# Patient Record
Sex: Female | Born: 1940
Health system: Southern US, Community
[De-identification: ages and names within clinical notes are randomized; demographics above are authoritative.]

## PROBLEM LIST (undated history)

## (undated) DIAGNOSIS — I10 Essential (primary) hypertension: Secondary | ICD-10-CM

## (undated) DIAGNOSIS — M1990 Unspecified osteoarthritis, unspecified site: Secondary | ICD-10-CM

## (undated) HISTORY — PX: ABDOMINAL HYSTERECTOMY: SHX81

---

## 2004-09-04 ENCOUNTER — Ambulatory Visit: Payer: Self-pay | Admitting: Family Medicine

## 2005-10-08 ENCOUNTER — Ambulatory Visit: Payer: Self-pay | Admitting: Family Medicine

## 2006-12-16 ENCOUNTER — Ambulatory Visit: Payer: Self-pay | Admitting: Family Medicine

## 2007-01-11 ENCOUNTER — Ambulatory Visit: Payer: Self-pay | Admitting: Gastroenterology

## 2007-12-21 ENCOUNTER — Ambulatory Visit: Payer: Self-pay | Admitting: Family Medicine

## 2008-12-22 ENCOUNTER — Ambulatory Visit: Payer: Self-pay | Admitting: Family Medicine

## 2009-12-24 ENCOUNTER — Ambulatory Visit: Payer: Self-pay | Admitting: Family Medicine

## 2011-03-10 ENCOUNTER — Ambulatory Visit: Payer: Self-pay | Admitting: Family Medicine

## 2012-03-11 ENCOUNTER — Ambulatory Visit: Payer: Self-pay

## 2013-03-14 ENCOUNTER — Ambulatory Visit: Payer: Self-pay

## 2014-03-29 ENCOUNTER — Ambulatory Visit: Payer: Self-pay | Admitting: Internal Medicine

## 2014-03-29 DIAGNOSIS — Z1231 Encounter for screening mammogram for malignant neoplasm of breast: Secondary | ICD-10-CM | POA: Diagnosis not present

## 2014-10-02 DIAGNOSIS — E780 Pure hypercholesterolemia: Secondary | ICD-10-CM | POA: Diagnosis not present

## 2014-10-02 DIAGNOSIS — Z79899 Other long term (current) drug therapy: Secondary | ICD-10-CM | POA: Diagnosis not present

## 2014-10-02 DIAGNOSIS — I10 Essential (primary) hypertension: Secondary | ICD-10-CM | POA: Diagnosis not present

## 2015-04-04 ENCOUNTER — Other Ambulatory Visit: Payer: Self-pay | Admitting: Internal Medicine

## 2015-04-04 DIAGNOSIS — Z1239 Encounter for other screening for malignant neoplasm of breast: Secondary | ICD-10-CM | POA: Diagnosis not present

## 2015-04-04 DIAGNOSIS — E7800 Pure hypercholesterolemia, unspecified: Secondary | ICD-10-CM | POA: Diagnosis not present

## 2015-04-04 DIAGNOSIS — I10 Essential (primary) hypertension: Secondary | ICD-10-CM | POA: Diagnosis not present

## 2015-04-04 DIAGNOSIS — Z79899 Other long term (current) drug therapy: Secondary | ICD-10-CM | POA: Diagnosis not present

## 2015-04-04 DIAGNOSIS — R7309 Other abnormal glucose: Secondary | ICD-10-CM | POA: Diagnosis not present

## 2015-04-04 DIAGNOSIS — N390 Urinary tract infection, site not specified: Secondary | ICD-10-CM | POA: Diagnosis not present

## 2015-04-04 DIAGNOSIS — Z1231 Encounter for screening mammogram for malignant neoplasm of breast: Secondary | ICD-10-CM

## 2015-04-04 DIAGNOSIS — Z0000 Encounter for general adult medical examination without abnormal findings: Secondary | ICD-10-CM | POA: Diagnosis not present

## 2015-04-05 DIAGNOSIS — N390 Urinary tract infection, site not specified: Secondary | ICD-10-CM | POA: Diagnosis not present

## 2015-04-16 ENCOUNTER — Other Ambulatory Visit: Payer: Self-pay | Admitting: Internal Medicine

## 2015-04-16 ENCOUNTER — Ambulatory Visit
Admission: RE | Admit: 2015-04-16 | Discharge: 2015-04-16 | Disposition: A | Discharge status: Home / Self Care | Payer: Commercial Managed Care - HMO | Source: Ambulatory Visit | Attending: Internal Medicine | Admitting: Internal Medicine

## 2015-04-16 DIAGNOSIS — Z1231 Encounter for screening mammogram for malignant neoplasm of breast: Secondary | ICD-10-CM | POA: Diagnosis not present

## 2015-10-08 DIAGNOSIS — E7800 Pure hypercholesterolemia, unspecified: Secondary | ICD-10-CM | POA: Diagnosis not present

## 2015-10-08 DIAGNOSIS — I10 Essential (primary) hypertension: Secondary | ICD-10-CM | POA: Diagnosis not present

## 2015-10-08 DIAGNOSIS — Z79899 Other long term (current) drug therapy: Secondary | ICD-10-CM | POA: Diagnosis not present

## 2015-10-08 DIAGNOSIS — R7309 Other abnormal glucose: Secondary | ICD-10-CM | POA: Diagnosis not present

## 2016-04-07 DIAGNOSIS — Z1231 Encounter for screening mammogram for malignant neoplasm of breast: Secondary | ICD-10-CM | POA: Diagnosis not present

## 2016-04-07 DIAGNOSIS — R7309 Other abnormal glucose: Secondary | ICD-10-CM | POA: Diagnosis not present

## 2016-04-07 DIAGNOSIS — Z79899 Other long term (current) drug therapy: Secondary | ICD-10-CM | POA: Diagnosis not present

## 2016-04-07 DIAGNOSIS — E7800 Pure hypercholesterolemia, unspecified: Secondary | ICD-10-CM | POA: Diagnosis not present

## 2016-04-07 DIAGNOSIS — I10 Essential (primary) hypertension: Secondary | ICD-10-CM | POA: Diagnosis not present

## 2016-04-07 DIAGNOSIS — Z0000 Encounter for general adult medical examination without abnormal findings: Secondary | ICD-10-CM | POA: Diagnosis not present

## 2016-04-22 ENCOUNTER — Other Ambulatory Visit: Payer: Self-pay | Admitting: Internal Medicine

## 2016-04-22 DIAGNOSIS — Z1231 Encounter for screening mammogram for malignant neoplasm of breast: Secondary | ICD-10-CM

## 2016-05-27 ENCOUNTER — Ambulatory Visit
Admission: RE | Admit: 2016-05-27 | Discharge: 2016-05-27 | Disposition: A | Discharge status: Home / Self Care | Payer: Medicare HMO | Source: Ambulatory Visit | Attending: Internal Medicine | Admitting: Internal Medicine

## 2016-05-27 DIAGNOSIS — Z1231 Encounter for screening mammogram for malignant neoplasm of breast: Secondary | ICD-10-CM | POA: Diagnosis not present

## 2016-10-28 DIAGNOSIS — I10 Essential (primary) hypertension: Secondary | ICD-10-CM | POA: Diagnosis not present

## 2016-10-28 DIAGNOSIS — E7800 Pure hypercholesterolemia, unspecified: Secondary | ICD-10-CM | POA: Diagnosis not present

## 2016-10-28 DIAGNOSIS — R3129 Other microscopic hematuria: Secondary | ICD-10-CM | POA: Diagnosis not present

## 2016-10-28 DIAGNOSIS — Z79899 Other long term (current) drug therapy: Secondary | ICD-10-CM | POA: Diagnosis not present

## 2016-10-28 DIAGNOSIS — N390 Urinary tract infection, site not specified: Secondary | ICD-10-CM | POA: Diagnosis not present

## 2017-02-10 DIAGNOSIS — M25551 Pain in right hip: Secondary | ICD-10-CM | POA: Diagnosis not present

## 2017-02-10 DIAGNOSIS — M1611 Unilateral primary osteoarthritis, right hip: Secondary | ICD-10-CM | POA: Diagnosis not present

## 2017-04-11 DIAGNOSIS — J0100 Acute maxillary sinusitis, unspecified: Secondary | ICD-10-CM | POA: Diagnosis not present

## 2017-05-05 DIAGNOSIS — Z1211 Encounter for screening for malignant neoplasm of colon: Secondary | ICD-10-CM | POA: Diagnosis not present

## 2017-05-05 DIAGNOSIS — Z79899 Other long term (current) drug therapy: Secondary | ICD-10-CM | POA: Diagnosis not present

## 2017-05-05 DIAGNOSIS — Z6841 Body Mass Index (BMI) 40.0 and over, adult: Secondary | ICD-10-CM | POA: Diagnosis not present

## 2017-05-05 DIAGNOSIS — I10 Essential (primary) hypertension: Secondary | ICD-10-CM | POA: Diagnosis not present

## 2017-05-05 DIAGNOSIS — Z1231 Encounter for screening mammogram for malignant neoplasm of breast: Secondary | ICD-10-CM | POA: Diagnosis not present

## 2017-05-05 DIAGNOSIS — E7800 Pure hypercholesterolemia, unspecified: Secondary | ICD-10-CM | POA: Diagnosis not present

## 2017-05-05 DIAGNOSIS — Z0000 Encounter for general adult medical examination without abnormal findings: Secondary | ICD-10-CM | POA: Diagnosis not present

## 2017-06-01 ENCOUNTER — Other Ambulatory Visit: Payer: Self-pay | Admitting: Internal Medicine

## 2017-06-01 DIAGNOSIS — Z1231 Encounter for screening mammogram for malignant neoplasm of breast: Secondary | ICD-10-CM

## 2017-06-23 ENCOUNTER — Ambulatory Visit
Admission: RE | Admit: 2017-06-23 | Discharge: 2017-06-23 | Disposition: A | Discharge status: Home / Self Care | Payer: Medicare HMO | Source: Ambulatory Visit | Attending: Internal Medicine | Admitting: Internal Medicine

## 2017-06-23 DIAGNOSIS — Z1231 Encounter for screening mammogram for malignant neoplasm of breast: Secondary | ICD-10-CM | POA: Insufficient documentation

## 2017-08-07 DIAGNOSIS — M25551 Pain in right hip: Secondary | ICD-10-CM | POA: Diagnosis not present

## 2017-08-07 DIAGNOSIS — M1611 Unilateral primary osteoarthritis, right hip: Secondary | ICD-10-CM | POA: Diagnosis not present

## 2017-08-07 DIAGNOSIS — G8929 Other chronic pain: Secondary | ICD-10-CM | POA: Diagnosis not present

## 2017-12-01 DIAGNOSIS — E7800 Pure hypercholesterolemia, unspecified: Secondary | ICD-10-CM | POA: Diagnosis not present

## 2017-12-01 DIAGNOSIS — I10 Essential (primary) hypertension: Secondary | ICD-10-CM | POA: Diagnosis not present

## 2017-12-01 DIAGNOSIS — G8929 Other chronic pain: Secondary | ICD-10-CM | POA: Diagnosis not present

## 2017-12-01 DIAGNOSIS — Z79899 Other long term (current) drug therapy: Secondary | ICD-10-CM | POA: Diagnosis not present

## 2017-12-01 DIAGNOSIS — Z1211 Encounter for screening for malignant neoplasm of colon: Secondary | ICD-10-CM | POA: Diagnosis not present

## 2017-12-01 DIAGNOSIS — M25561 Pain in right knee: Secondary | ICD-10-CM | POA: Diagnosis not present

## 2018-09-06 DIAGNOSIS — R262 Difficulty in walking, not elsewhere classified: Secondary | ICD-10-CM | POA: Diagnosis not present

## 2018-12-21 DIAGNOSIS — I10 Essential (primary) hypertension: Secondary | ICD-10-CM | POA: Diagnosis not present

## 2018-12-21 DIAGNOSIS — E7800 Pure hypercholesterolemia, unspecified: Secondary | ICD-10-CM | POA: Diagnosis not present

## 2018-12-22 ENCOUNTER — Other Ambulatory Visit: Payer: Self-pay | Admitting: Internal Medicine

## 2018-12-22 DIAGNOSIS — Z1231 Encounter for screening mammogram for malignant neoplasm of breast: Secondary | ICD-10-CM

## 2019-01-18 DIAGNOSIS — I10 Essential (primary) hypertension: Secondary | ICD-10-CM | POA: Diagnosis not present

## 2019-01-18 DIAGNOSIS — Z79899 Other long term (current) drug therapy: Secondary | ICD-10-CM | POA: Diagnosis not present

## 2019-01-18 DIAGNOSIS — E7800 Pure hypercholesterolemia, unspecified: Secondary | ICD-10-CM | POA: Diagnosis not present

## 2019-01-25 DIAGNOSIS — I10 Essential (primary) hypertension: Secondary | ICD-10-CM | POA: Diagnosis not present

## 2019-03-14 ENCOUNTER — Ambulatory Visit
Admission: RE | Admit: 2019-03-14 | Discharge: 2019-03-14 | Disposition: A | Discharge status: Home / Self Care | Payer: Medicare HMO | Source: Ambulatory Visit | Attending: Internal Medicine | Admitting: Internal Medicine

## 2019-03-14 DIAGNOSIS — Z1231 Encounter for screening mammogram for malignant neoplasm of breast: Secondary | ICD-10-CM | POA: Diagnosis not present

## 2019-03-23 DIAGNOSIS — L299 Pruritus, unspecified: Secondary | ICD-10-CM | POA: Diagnosis not present

## 2019-03-23 DIAGNOSIS — I10 Essential (primary) hypertension: Secondary | ICD-10-CM | POA: Diagnosis not present

## 2019-03-28 DIAGNOSIS — D2261 Melanocytic nevi of right upper limb, including shoulder: Secondary | ICD-10-CM | POA: Diagnosis not present

## 2019-03-28 DIAGNOSIS — D225 Melanocytic nevi of trunk: Secondary | ICD-10-CM | POA: Diagnosis not present

## 2019-03-28 DIAGNOSIS — D2262 Melanocytic nevi of left upper limb, including shoulder: Secondary | ICD-10-CM | POA: Diagnosis not present

## 2019-03-28 DIAGNOSIS — L309 Dermatitis, unspecified: Secondary | ICD-10-CM | POA: Diagnosis not present

## 2019-03-30 ENCOUNTER — Ambulatory Visit: Payer: Medicare HMO

## 2019-03-30 ENCOUNTER — Other Ambulatory Visit: Payer: Self-pay | Admitting: Physical Medicine and Rehabilitation

## 2019-03-30 DIAGNOSIS — G8929 Other chronic pain: Secondary | ICD-10-CM | POA: Diagnosis not present

## 2019-03-30 DIAGNOSIS — M25551 Pain in right hip: Secondary | ICD-10-CM | POA: Diagnosis not present

## 2019-03-30 DIAGNOSIS — S73004A Unspecified dislocation of right hip, initial encounter: Secondary | ICD-10-CM

## 2019-03-30 DIAGNOSIS — M1611 Unilateral primary osteoarthritis, right hip: Secondary | ICD-10-CM | POA: Diagnosis not present

## 2019-03-31 ENCOUNTER — Other Ambulatory Visit: Payer: Self-pay

## 2019-03-31 ENCOUNTER — Ambulatory Visit
Admission: RE | Admit: 2019-03-31 | Discharge: 2019-03-31 | Disposition: A | Discharge status: Home / Self Care | Payer: Medicare HMO | Source: Ambulatory Visit | Attending: Physical Medicine and Rehabilitation | Admitting: Physical Medicine and Rehabilitation

## 2019-03-31 DIAGNOSIS — M25551 Pain in right hip: Secondary | ICD-10-CM | POA: Insufficient documentation

## 2019-03-31 DIAGNOSIS — S73004A Unspecified dislocation of right hip, initial encounter: Secondary | ICD-10-CM | POA: Diagnosis not present

## 2019-04-01 ENCOUNTER — Other Ambulatory Visit
Admission: RE | Admit: 2019-04-01 | Discharge: 2019-04-01 | Disposition: A | Discharge status: Home / Self Care | Payer: Medicare HMO | Source: Ambulatory Visit | Attending: Orthopedic Surgery | Admitting: Orthopedic Surgery

## 2019-04-01 ENCOUNTER — Encounter
Admission: RE | Admit: 2019-04-01 | Discharge: 2019-04-01 | Disposition: A | Discharge status: Home / Self Care | Payer: Medicare HMO | Source: Ambulatory Visit | Attending: Orthopedic Surgery | Admitting: Orthopedic Surgery

## 2019-04-01 ENCOUNTER — Other Ambulatory Visit: Payer: Self-pay

## 2019-04-01 ENCOUNTER — Other Ambulatory Visit: Payer: Self-pay | Admitting: Orthopedic Surgery

## 2019-04-01 ENCOUNTER — Other Ambulatory Visit: Admission: RE | Admit: 2019-04-01 | Payer: Medicare HMO | Source: Ambulatory Visit

## 2019-04-01 DIAGNOSIS — Z20822 Contact with and (suspected) exposure to covid-19: Secondary | ICD-10-CM | POA: Diagnosis not present

## 2019-04-01 DIAGNOSIS — Z01818 Encounter for other preprocedural examination: Secondary | ICD-10-CM | POA: Insufficient documentation

## 2019-04-01 DIAGNOSIS — M1611 Unilateral primary osteoarthritis, right hip: Secondary | ICD-10-CM | POA: Diagnosis not present

## 2019-04-01 DIAGNOSIS — R9431 Abnormal electrocardiogram [ECG] [EKG]: Secondary | ICD-10-CM | POA: Insufficient documentation

## 2019-04-01 DIAGNOSIS — M247 Protrusio acetabuli: Secondary | ICD-10-CM | POA: Diagnosis not present

## 2019-04-01 HISTORY — DX: Unspecified osteoarthritis, unspecified site: M19.90

## 2019-04-01 HISTORY — DX: Essential (primary) hypertension: I10

## 2019-04-01 LAB — COMPREHENSIVE METABOLIC PANEL
ALT: 10 U/L (ref 0–44)
AST: 16 U/L (ref 15–41)
Albumin: 4.2 g/dL (ref 3.5–5.0)
Alkaline Phosphatase: 59 U/L (ref 38–126)
Anion gap: 11 (ref 5–15)
BUN: 16 mg/dL (ref 8–23)
CO2: 26 mmol/L (ref 22–32)
Calcium: 9.5 mg/dL (ref 8.9–10.3)
Chloride: 103 mmol/L (ref 98–111)
Creatinine, Ser: 0.64 mg/dL (ref 0.44–1.00)
GFR calc Af Amer: >60 mL/min (ref >60)
GFR calc non Af Amer: >60 mL/min (ref >60)
Glucose, Bld: 102 mg/dL — ABNORMAL HIGH (ref 70–99)
Potassium: 4.1 mmol/L (ref 3.5–5.1)
Sodium: 140 mmol/L (ref 135–145)
Total Bilirubin: 0.7 mg/dL (ref 0.3–1.2)
Total Protein: 7.6 g/dL (ref 6.5–8.1)

## 2019-04-01 LAB — CBC WITH DIFFERENTIAL/PLATELET
Abs Immature Granulocytes: 0.02 10*3/uL (ref 0.00–0.07)
Basophils Absolute: 0 10*3/uL (ref 0.0–0.1)
Basophils Relative: 0 %
Eosinophils Absolute: 0.1 10*3/uL (ref 0.0–0.5)
Eosinophils Relative: 1 %
HCT: 45.9 % (ref 36.0–46.0)
Hemoglobin: 14.7 g/dL (ref 12.0–15.0)
Immature Granulocytes: 0 %
Lymphocytes Relative: 10 %
Lymphs Abs: 0.7 10*3/uL (ref 0.7–4.0)
MCH: 29.7 pg (ref 26.0–34.0)
MCHC: 32 g/dL (ref 30.0–36.0)
MCV: 92.7 fL (ref 80.0–100.0)
Monocytes Absolute: 0.5 10*3/uL (ref 0.1–1.0)
Monocytes Relative: 7 %
Neutro Abs: 5.8 10*3/uL (ref 1.7–7.7)
Neutrophils Relative %: 82 %
Platelets: 340 10*3/uL (ref 150–400)
RBC: 4.95 MIL/uL (ref 3.87–5.11)
RDW: 14.2 % (ref 11.5–15.5)
WBC: 7.1 10*3/uL (ref 4.0–10.5)
nRBC: 0 % (ref 0.0–0.2)

## 2019-04-01 LAB — TYPE AND SCREEN
ABO/RH(D): O POS
Antibody Screen: NEGATIVE

## 2019-04-01 LAB — URINALYSIS, ROUTINE W REFLEX MICROSCOPIC
Bilirubin Urine: NEGATIVE
Glucose, UA: NEGATIVE mg/dL
Hgb urine dipstick: NEGATIVE
Ketones, ur: NEGATIVE mg/dL
Leukocytes,Ua: NEGATIVE
Nitrite: NEGATIVE
Protein, ur: NEGATIVE mg/dL
Specific Gravity, Urine: 1.014 (ref 1.005–1.030)
pH: 6 (ref 5.0–8.0)

## 2019-04-01 LAB — SURGICAL PCR SCREEN
MRSA, PCR: NEGATIVE
Staphylococcus aureus: POSITIVE — AB

## 2019-04-01 LAB — SARS CORONAVIRUS 2 (TAT 6-24 HRS): SARS Coronavirus 2: NEGATIVE

## 2019-04-01 NOTE — Patient Instructions (Signed)
INSTRUCTIONS FOR SURGERY     Your surgery is scheduled for:   Tuesday, February 9TH     To find out your arrival time for the day of surgery,          please call [phone] between 1 pm and 3 pm on :  Monday, February 8TH     When you arrive for surgery, report to the [facility].       Do NOT stop on the first floor to register.    REMEMBER: Instructions that are not followed completely may result in serious medical risk,  up to and including death, or upon the discretion of your surgeon and anesthesiologist,            your surgery may need to be rescheduled.  __X__ 1. Do not eat food after midnight the night before your procedure.                    No gum, candy, lozenger, tic tacs, tums or hard candies.                  ABSOLUTELY NOTHING SOLID IN YOUR MOUTH AFTER MIDNIGHT                    You may drink unlimited clear liquids up to 2 hours before you are scheduled to arrive for surgery.                   Do not drink anything within those 2 hours unless you need to take medicine, then take the                   smallest amount you need.  Clear liquids include:  water, apple juice without pulp,                   any flavor Gatorade, Black coffee, black tea.  Sugar may be added but no dairy/ honey /lemon.                        Broth and jello is not considered a clear liquid.  __x__  2. On the morning of surgery, please brush your teeth with toothpaste and water. You may rinse with                  mouthwash if you wish but DO NOT SWALLOW TOOTHPASTE OR MOUTHWASH  __X___3. NO alcohol for 24 hours before or after surgery.  __x___ 4.  Do NOT smoke or use e-cigarettes for 24 HOURS PRIOR TO SURGERY.                      DO NOT Use any chewable tobacco products for at least 6 hours prior to surgery.  __x___ 5. If you start any new medication after this appointment and prior to surgery, please        Bring it with you on the day of surgery.  ___x__ 6. Notify your doctor if there is any change in your medical condition, such as fever, infection, vomitting,  Diarrhea or any open sores.  __x___ 7.  USE the CHG SOAP as instructed, the night before surgery and the day of surgery.                   Once you have washed with this soap, do NOT use any of the following: Powders, perfumes                    or lotions. Please do not wear make up, hairpins, clips or nail polish. You MAY wear deodorant.                   Men may shave their face and neck.  Women need to shave 48 hours prior to surgery.                   DO NOT wear ANY jewelry on the day of surgery. If there are rings that are too tight to                    remove easily, please address this prior to the surgery day. Piercings need to be removed.                                                                     NO METAL ON YOUR BODY.                    Do NOT bring any valuables.  If you came to Pre-Admit testing then you will not need license,                     insurance card or credit card.  If you will be staying overnight, please either leave your things in                     the car or have your family be responsible for these items.                     [facility] IS NOT RESPONSIBLE FOR BELONGINGS OR VALUABLES.  ___X__ 8. DO NOT wear contact lenses on surgery day.  You may not have dentures,                     Hearing aides, contacts or glasses in the operating room. These items can be                    Placed in the Recovery Room to receive immediately after surgery.  __x___ 9. IF YOU ARE SCHEDULED TO GO HOME ON THE SAME DAY, YOU MUST                   Have someone to drive you home and to stay with you  for the first 24 hours.                    Have an arrangement prior to arriving on surgery day.  ___x__ 10. Take the following medications on the morning of surgery with a sip of water:  1.  NOTHING                     2.                     3.                                                    _____ 11.  Follow any instructions provided to you by your surgeon.                        Such as enema, clear liquid bowel prep  __X__  12. STOP  ASPIRIN AS OF: IMMEDIATELY TODAY                       THIS INCLUDES BC POWDERS / GOODIES POWDER  __x___ 13. STOP Anti-inflammatories as of: IMMEDIATELY TODAY                      This includes IBUPROFEN / MOTRIN / ADVIL / ALEVE/ NAPROXYN                    YOU MAY TAKE TYLENOL ANY TIME PRIOR TO SURGERY.  _____ 15. Bring your CPAP machine into preop with you on the morning of surgery.  _______17.  Continue to take the following medications but do not take on the morning of surgery:                          LISINOPRIL  ______18. If staying overnight, please have appropriate shoes to wear to be able to walk around the unit.                   Wear clean and comfortable clothing to the hospital.   [facility] PHONE AND CHARGER.  HAVE STABLE SHOES TO AMBULATE ON UNIT.

## 2019-04-01 NOTE — Patient Instructions (Signed)
See preop visit for notes. Hard copy also on chart

## 2019-04-04 MED ORDER — CEFAZOLIN SODIUM-DEXTROSE 2-4 GM/100ML-% IV SOLN
2.0000 g | INTRAVENOUS | Status: AC
  Administered 2019-04-05: 2 g via INTRAVENOUS

## 2019-04-05 ENCOUNTER — Inpatient Hospital Stay: Payer: Medicare HMO | Admitting: Certified Registered"

## 2019-04-05 ENCOUNTER — Encounter: Payer: Self-pay | Admitting: Orthopedic Surgery

## 2019-04-05 ENCOUNTER — Inpatient Hospital Stay: Payer: Medicare HMO

## 2019-04-05 ENCOUNTER — Other Ambulatory Visit: Payer: Self-pay

## 2019-04-05 ENCOUNTER — Inpatient Hospital Stay
Admission: RE | Admit: 2019-04-05 | Discharge: 2019-04-07 | DRG: 470 | Disposition: A | Discharge status: Home Health | Payer: Medicare HMO | Attending: Orthopedic Surgery | Admitting: Orthopedic Surgery

## 2019-04-05 ENCOUNTER — Encounter
Admission: RE | Disposition: A | Discharge status: Home Health | Payer: Self-pay | Source: Ambulatory Visit | Attending: Orthopedic Surgery

## 2019-04-05 DIAGNOSIS — M1611 Unilateral primary osteoarthritis, right hip: Secondary | ICD-10-CM | POA: Diagnosis not present

## 2019-04-05 DIAGNOSIS — I10 Essential (primary) hypertension: Secondary | ICD-10-CM | POA: Diagnosis not present

## 2019-04-05 DIAGNOSIS — Z96641 Presence of right artificial hip joint: Secondary | ICD-10-CM

## 2019-04-05 DIAGNOSIS — Z01818 Encounter for other preprocedural examination: Secondary | ICD-10-CM | POA: Diagnosis not present

## 2019-04-05 DIAGNOSIS — G8918 Other acute postprocedural pain: Secondary | ICD-10-CM

## 2019-04-05 DIAGNOSIS — Z471 Aftercare following joint replacement surgery: Secondary | ICD-10-CM | POA: Diagnosis not present

## 2019-04-05 DIAGNOSIS — Z419 Encounter for procedure for purposes other than remedying health state, unspecified: Secondary | ICD-10-CM

## 2019-04-05 HISTORY — PX: TOTAL HIP ARTHROPLASTY: SHX124

## 2019-04-05 LAB — CBC
HCT: 37 % (ref 36.0–46.0)
Hemoglobin: 12 g/dL (ref 12.0–15.0)
MCH: 30.2 pg (ref 26.0–34.0)
MCHC: 32.4 g/dL (ref 30.0–36.0)
MCV: 93 fL (ref 80.0–100.0)
Platelets: 282 10*3/uL (ref 150–400)
RBC: 3.98 MIL/uL (ref 3.87–5.11)
RDW: 14.1 % (ref 11.5–15.5)
WBC: 15.2 10*3/uL — ABNORMAL HIGH (ref 4.0–10.5)
nRBC: 0 % (ref 0.0–0.2)

## 2019-04-05 LAB — CREATININE, SERUM
Creatinine, Ser: 0.64 mg/dL (ref 0.44–1.00)
GFR calc Af Amer: >60 mL/min (ref >60)
GFR calc non Af Amer: >60 mL/min (ref >60)

## 2019-04-05 LAB — ABO/RH: ABO/RH(D): O POS

## 2019-04-05 SURGERY — ARTHROPLASTY, HIP, TOTAL, ANTERIOR APPROACH
Anesthesia: Spinal | Site: Hip | Laterality: Right

## 2019-04-05 MED ORDER — NEOMYCIN-POLYMYXIN B GU 40-200000 IR SOLN
Status: DC | PRN
  Administered 2019-04-05: 4 mL

## 2019-04-05 MED ORDER — KETAMINE HCL 50 MG/ML IJ SOLN
INTRAMUSCULAR | Status: AC
  Filled 2019-04-05: qty 10

## 2019-04-05 MED ORDER — MORPHINE SULFATE (PF) 2 MG/ML IV SOLN: 0.5000 mg | INTRAVENOUS | Status: DC | PRN

## 2019-04-05 MED ORDER — DOCUSATE SODIUM 100 MG PO CAPS
100.0000 mg | ORAL_CAPSULE | Freq: Two times a day (BID) | ORAL | Status: DC
  Administered 2019-04-05 – 2019-04-07 (×4): 100 mg via ORAL
  Filled 2019-04-05 (×4): qty 1

## 2019-04-05 MED ORDER — LIDOCAINE HCL (PF) 1 % IJ SOLN
INTRAMUSCULAR | Status: AC
  Filled 2019-04-05: qty 30

## 2019-04-05 MED ORDER — METOCLOPRAMIDE HCL 10 MG PO TABS: 5.0000 mg | ORAL_TABLET | Freq: Three times a day (TID) | ORAL | Status: DC | PRN

## 2019-04-05 MED ORDER — LACTATED RINGERS IV SOLN: INTRAVENOUS | Status: DC

## 2019-04-05 MED ORDER — NEOMYCIN-POLYMYXIN B GU 40-200000 IR SOLN
Status: AC
  Filled 2019-04-05: qty 20

## 2019-04-05 MED ORDER — PROPOFOL 10 MG/ML IV BOLUS
INTRAVENOUS | Status: DC | PRN
  Administered 2019-04-05: 30 mg via INTRAVENOUS
  Administered 2019-04-05: 25 ug/kg/min via INTRAVENOUS
  Administered 2019-04-05: 20 mg via INTRAVENOUS
  Administered 2019-04-05: 30 mg via INTRAVENOUS

## 2019-04-05 MED ORDER — HYDROXYZINE HCL 25 MG PO TABS
25.0000 mg | ORAL_TABLET | Freq: Three times a day (TID) | ORAL | Status: DC | PRN
  Filled 2019-04-05: qty 1

## 2019-04-05 MED ORDER — LIDOCAINE HCL (PF) 2 % IJ SOLN
INTRAMUSCULAR | Status: DC | PRN
  Administered 2019-04-05: 40 mg via INTRADERMAL

## 2019-04-05 MED ORDER — PROPOFOL 10 MG/ML IV BOLUS
INTRAVENOUS | Status: AC
  Filled 2019-04-05: qty 20

## 2019-04-05 MED ORDER — ONDANSETRON HCL 4 MG/2ML IJ SOLN
INTRAMUSCULAR | Status: DC | PRN
  Administered 2019-04-05: 4 mg via INTRAVENOUS

## 2019-04-05 MED ORDER — BISACODYL 10 MG RE SUPP: 10.0000 mg | Freq: Every day | RECTAL | Status: DC | PRN

## 2019-04-05 MED ORDER — EPINEPHRINE PF 1 MG/ML IJ SOLN
INTRAMUSCULAR | Status: AC
  Filled 2019-04-05: qty 1

## 2019-04-05 MED ORDER — SODIUM CHLORIDE 0.9 % IV SOLN
INTRAVENOUS | Status: DC | PRN
  Administered 2019-04-05: 60 mL

## 2019-04-05 MED ORDER — FAMOTIDINE 20 MG PO TABS
ORAL_TABLET | ORAL | Status: AC
  Administered 2019-04-05: 20 mg via ORAL
  Filled 2019-04-05: qty 1

## 2019-04-05 MED ORDER — DEXAMETHASONE SODIUM PHOSPHATE 10 MG/ML IJ SOLN
INTRAMUSCULAR | Status: DC | PRN
  Administered 2019-04-05: 5 mg via INTRAVENOUS

## 2019-04-05 MED ORDER — SODIUM CHLORIDE (PF) 0.9 % IJ SOLN
INTRAMUSCULAR | Status: AC
  Filled 2019-04-05: qty 50

## 2019-04-05 MED ORDER — BUPIVACAINE HCL (PF) 0.25 % IJ SOLN
INTRAMUSCULAR | Status: AC
  Filled 2019-04-05: qty 30

## 2019-04-05 MED ORDER — METOPROLOL TARTRATE 25 MG PO TABS
12.5000 mg | ORAL_TABLET | Freq: Two times a day (BID) | ORAL | Status: DC
  Administered 2019-04-05 – 2019-04-07 (×4): 12.5 mg via ORAL
  Filled 2019-04-05 (×4): qty 1

## 2019-04-05 MED ORDER — ACETAMINOPHEN 500 MG PO TABS
500.0000 mg | ORAL_TABLET | Freq: Four times a day (QID) | ORAL | Status: AC
  Administered 2019-04-05 – 2019-04-06 (×4): 500 mg via ORAL
  Filled 2019-04-05 (×4): qty 1

## 2019-04-05 MED ORDER — LISINOPRIL 20 MG PO TABS
20.0000 mg | ORAL_TABLET | Freq: Two times a day (BID) | ORAL | Status: DC
  Administered 2019-04-05 – 2019-04-07 (×4): 20 mg via ORAL
  Filled 2019-04-05 (×4): qty 1

## 2019-04-05 MED ORDER — METHOCARBAMOL 1000 MG/10ML IJ SOLN
500.0000 mg | Freq: Four times a day (QID) | INTRAVENOUS | Status: DC | PRN
  Filled 2019-04-05: qty 5

## 2019-04-05 MED ORDER — ALUM & MAG HYDROXIDE-SIMETH 200-200-20 MG/5ML PO SUSP: 30.0000 mL | ORAL | Status: DC | PRN

## 2019-04-05 MED ORDER — BUPIVACAINE HCL (PF) 0.5 % IJ SOLN
INTRAMUSCULAR | Status: AC
  Filled 2019-04-05: qty 10

## 2019-04-05 MED ORDER — PROPOFOL 500 MG/50ML IV EMUL
INTRAVENOUS | Status: AC
  Filled 2019-04-05: qty 50

## 2019-04-05 MED ORDER — ACETAMINOPHEN 325 MG PO TABS: 325.0000 mg | ORAL_TABLET | Freq: Four times a day (QID) | ORAL | Status: DC | PRN

## 2019-04-05 MED ORDER — ONDANSETRON HCL 4 MG/2ML IJ SOLN: 4.0000 mg | Freq: Four times a day (QID) | INTRAMUSCULAR | Status: DC | PRN

## 2019-04-05 MED ORDER — PANTOPRAZOLE SODIUM 40 MG PO TBEC
40.0000 mg | DELAYED_RELEASE_TABLET | Freq: Every day | ORAL | Status: DC
  Administered 2019-04-06 – 2019-04-07 (×2): 40 mg via ORAL
  Filled 2019-04-05 (×2): qty 1

## 2019-04-05 MED ORDER — MAGNESIUM CITRATE PO SOLN
1.0000 | Freq: Once | ORAL | Status: DC | PRN
  Filled 2019-04-05: qty 296

## 2019-04-05 MED ORDER — FAMOTIDINE 20 MG PO TABS: 20.0000 mg | ORAL_TABLET | Freq: Once | ORAL | Status: AC

## 2019-04-05 MED ORDER — LIDOCAINE HCL 1 % IJ SOLN
INTRAMUSCULAR | Status: DC | PRN
  Administered 2019-04-05: 30 mL

## 2019-04-05 MED ORDER — HYDROCODONE-ACETAMINOPHEN 5-325 MG PO TABS
1.0000 | ORAL_TABLET | ORAL | Status: DC | PRN
  Administered 2019-04-05 – 2019-04-06 (×3): 1 via ORAL
  Filled 2019-04-05 (×3): qty 1

## 2019-04-05 MED ORDER — SODIUM CHLORIDE (PF) 0.9 % IJ SOLN
INTRAMUSCULAR | Status: DC | PRN
  Administered 2019-04-05: 40 mL

## 2019-04-05 MED ORDER — BUPIVACAINE-EPINEPHRINE 0.25% -1:200000 IJ SOLN
INTRAMUSCULAR | Status: DC | PRN
  Administered 2019-04-05: 30 mL

## 2019-04-05 MED ORDER — FENTANYL CITRATE (PF) 100 MCG/2ML IJ SOLN
INTRAMUSCULAR | Status: AC
  Filled 2019-04-05: qty 2

## 2019-04-05 MED ORDER — CHLORHEXIDINE GLUCONATE 4 % EX LIQD
60.0000 mL | Freq: Once | CUTANEOUS | Status: AC
  Administered 2019-04-05: 4 via TOPICAL

## 2019-04-05 MED ORDER — CEFAZOLIN SODIUM-DEXTROSE 2-4 GM/100ML-% IV SOLN
INTRAVENOUS | Status: AC
  Filled 2019-04-05: qty 100

## 2019-04-05 MED ORDER — KETAMINE HCL 50 MG/ML IJ SOLN
INTRAMUSCULAR | Status: DC | PRN
  Administered 2019-04-05: 25 mg via INTRAVENOUS

## 2019-04-05 MED ORDER — PHENOL 1.4 % MT LIQD
1.0000 | OROMUCOSAL | Status: DC | PRN
  Filled 2019-04-05: qty 177

## 2019-04-05 MED ORDER — ONDANSETRON HCL 4 MG PO TABS: 4.0000 mg | ORAL_TABLET | Freq: Four times a day (QID) | ORAL | Status: DC | PRN

## 2019-04-05 MED ORDER — MAGNESIUM HYDROXIDE 400 MG/5ML PO SUSP
30.0000 mL | Freq: Every day | ORAL | Status: DC | PRN
  Administered 2019-04-07: 30 mL via ORAL
  Filled 2019-04-05: qty 30

## 2019-04-05 MED ORDER — FENTANYL CITRATE (PF) 100 MCG/2ML IJ SOLN
INTRAMUSCULAR | Status: DC | PRN
  Administered 2019-04-05 (×2): 25 ug via INTRAVENOUS
  Administered 2019-04-05: 50 ug via INTRAVENOUS

## 2019-04-05 MED ORDER — MENTHOL 3 MG MT LOZG
1.0000 | LOZENGE | OROMUCOSAL | Status: DC | PRN
  Filled 2019-04-05: qty 9

## 2019-04-05 MED ORDER — CEFAZOLIN SODIUM-DEXTROSE 1-4 GM/50ML-% IV SOLN
1.0000 g | Freq: Four times a day (QID) | INTRAVENOUS | Status: AC
  Administered 2019-04-05 – 2019-04-06 (×3): 1 g via INTRAVENOUS
  Filled 2019-04-05 (×3): qty 50

## 2019-04-05 MED ORDER — HYDROCODONE-ACETAMINOPHEN 7.5-325 MG PO TABS: 1.0000 | ORAL_TABLET | ORAL | Status: DC | PRN

## 2019-04-05 MED ORDER — ENOXAPARIN SODIUM 40 MG/0.4ML ~~LOC~~ SOLN
40.0000 mg | SUBCUTANEOUS | Status: DC
  Administered 2019-04-06 – 2019-04-07 (×2): 40 mg via SUBCUTANEOUS
  Filled 2019-04-05 (×2): qty 0.4

## 2019-04-05 MED ORDER — METOCLOPRAMIDE HCL 5 MG/ML IJ SOLN: 5.0000 mg | Freq: Three times a day (TID) | INTRAMUSCULAR | Status: DC | PRN

## 2019-04-05 MED ORDER — PHENYLEPHRINE HCL (PRESSORS) 10 MG/ML IV SOLN
INTRAVENOUS | Status: DC | PRN
  Administered 2019-04-05 (×3): 100 ug via INTRAVENOUS
  Administered 2019-04-05 (×2): 200 ug via INTRAVENOUS
  Administered 2019-04-05 (×3): 100 ug via INTRAVENOUS
  Administered 2019-04-05: 200 ug via INTRAVENOUS
  Administered 2019-04-05 (×4): 100 ug via INTRAVENOUS
  Administered 2019-04-05: 50 ug via INTRAVENOUS
  Administered 2019-04-05 (×2): 100 ug via INTRAVENOUS
  Administered 2019-04-05: 200 ug via INTRAVENOUS
  Administered 2019-04-05: 150 ug via INTRAVENOUS
  Administered 2019-04-05 (×3): 100 ug via INTRAVENOUS

## 2019-04-05 MED ORDER — METHOCARBAMOL 500 MG PO TABS: 500.0000 mg | ORAL_TABLET | Freq: Four times a day (QID) | ORAL | Status: DC | PRN

## 2019-04-05 MED ORDER — HEMOSTATIC AGENTS (NO CHARGE) OPTIME
TOPICAL | Status: DC | PRN
  Administered 2019-04-05: 2 via TOPICAL

## 2019-04-05 MED ORDER — BUPIVACAINE HCL (PF) 0.5 % IJ SOLN
INTRAMUSCULAR | Status: DC | PRN
  Administered 2019-04-05: 2.5 mL via INTRATHECAL

## 2019-04-05 MED ORDER — BUPIVACAINE LIPOSOME 1.3 % IJ SUSP
INTRAMUSCULAR | Status: AC
  Filled 2019-04-05: qty 20

## 2019-04-05 MED ORDER — ZOLPIDEM TARTRATE 5 MG PO TABS: 5.0000 mg | ORAL_TABLET | Freq: Every evening | ORAL | Status: DC | PRN

## 2019-04-05 MED ORDER — TRAMADOL HCL 50 MG PO TABS
50.0000 mg | ORAL_TABLET | Freq: Four times a day (QID) | ORAL | Status: DC
  Administered 2019-04-05 – 2019-04-07 (×6): 50 mg via ORAL
  Filled 2019-04-05 (×6): qty 1

## 2019-04-05 SURGICAL SUPPLY — 65 items
BLADE SAGITTAL AGGR TOOTH XLG (BLADE) ×3 IMPLANT
BNDG COHESIVE 6X5 TAN STRL LF (GAUZE/BANDAGES/DRESSINGS) ×6 IMPLANT
CANISTER SUCT 1200ML W/VALVE (MISCELLANEOUS) ×3 IMPLANT
CANISTER WOUND CARE 500ML ATS (WOUND CARE) ×3 IMPLANT
CEMENT BONE 1-PACK (Cement) ×6 IMPLANT
CEMENT RESTRICTOR DEPUY SZ 4 (Cement) ×3 IMPLANT
CHLORAPREP W/TINT 26 (MISCELLANEOUS) ×3 IMPLANT
COVER BACK TABLE REUSABLE LG (DRAPES) ×3 IMPLANT
COVER WAND RF STERILE (DRAPES) ×3 IMPLANT
DRAPE 3/4 80X56 (DRAPES) ×9 IMPLANT
DRAPE C-ARM XRAY 36X54 (DRAPES) ×3 IMPLANT
DRAPE INCISE IOBAN 66X60 STRL (DRAPES) IMPLANT
DRAPE POUCH INSTRU U-SHP 10X18 (DRAPES) ×3 IMPLANT
DRESSING SURGICEL FIBRLLR 1X2 (HEMOSTASIS) ×2 IMPLANT
DRSG OPSITE POSTOP 4X8 (GAUZE/BANDAGES/DRESSINGS) IMPLANT
DRSG SURGICEL FIBRILLAR 1X2 (HEMOSTASIS) ×6
ELECT BLADE 6.5 EXT (BLADE) ×3 IMPLANT
ELECT REM PT RETURN 9FT ADLT (ELECTROSURGICAL) ×3
ELECTRODE REM PT RTRN 9FT ADLT (ELECTROSURGICAL) ×1 IMPLANT
GLOVE BIOGEL PI IND STRL 9 (GLOVE) ×1 IMPLANT
GLOVE BIOGEL PI INDICATOR 9 (GLOVE) ×2
GLOVE SURG SYN 9.0  PF PI (GLOVE) ×4
GLOVE SURG SYN 9.0 PF PI (GLOVE) ×2 IMPLANT
GOWN SRG 2XL LVL 4 RGLN SLV (GOWNS) ×1 IMPLANT
GOWN STRL NON-REIN 2XL LVL4 (GOWNS) ×2
GOWN STRL REUS W/ TWL LRG LVL3 (GOWN DISPOSABLE) ×1 IMPLANT
GOWN STRL REUS W/TWL LRG LVL3 (GOWN DISPOSABLE) ×2
HANDLE YANKAUER SUCT BULB TIP (MISCELLANEOUS) ×3 IMPLANT
HEMOVAC 400CC 10FR (MISCELLANEOUS) IMPLANT
HIP FEM HD S 28 (Head) ×3 IMPLANT
HOLDER FOLEY CATH W/STRAP (MISCELLANEOUS) ×3 IMPLANT
HOOD PEEL AWAY FLYTE STAYCOOL (MISCELLANEOUS) ×3 IMPLANT
KIT PREVENA INCISION MGT 13 (CANNISTER) ×3 IMPLANT
LINER DM 28MM (Liner) ×3 IMPLANT
LINER DM SZH 28X56 (Liner) ×1 IMPLANT
MAT ABSORB  FLUID 56X50 GRAY (MISCELLANEOUS) ×2
MAT ABSORB FLUID 56X50 GRAY (MISCELLANEOUS) ×1 IMPLANT
NDL SAFETY ECLIPSE 18X1.5 (NEEDLE) ×1 IMPLANT
NEEDLE HYPO 18GX1.5 SHARP (NEEDLE) ×2
NEEDLE HYPO 22GX1.5 SAFETY (NEEDLE) ×3 IMPLANT
NEEDLE SPNL 20GX3.5 QUINCKE YW (NEEDLE) ×6 IMPLANT
NS IRRIG 1000ML POUR BTL (IV SOLUTION) ×3 IMPLANT
PACK HIP COMPR (MISCELLANEOUS) ×3 IMPLANT
PENCIL SMOKE EVACUATOR COATED (MISCELLANEOUS) ×3 IMPLANT
SCALPEL PROTECTED #10 DISP (BLADE) ×6 IMPLANT
SHELL ACETABULAR DM  56MM (Shell) ×3 IMPLANT
SOL PREP PVP 2OZ (MISCELLANEOUS) ×3
SOLUTION PREP PVP 2OZ (MISCELLANEOUS) ×1 IMPLANT
SPONGE DRAIN TRACH 4X4 STRL 2S (GAUZE/BANDAGES/DRESSINGS) IMPLANT
STAPLER SKIN PROX 35W (STAPLE) ×3 IMPLANT
STEM FEM HIP SZ4 STD (Stem) ×3 IMPLANT
STRAP SAFETY 5IN WIDE (MISCELLANEOUS) ×3 IMPLANT
SUT DVC 2 QUILL PDO  T11 36X36 (SUTURE) ×2
SUT DVC 2 QUILL PDO T11 36X36 (SUTURE) ×1 IMPLANT
SUT SILK 0 (SUTURE) ×2
SUT SILK 0 30XBRD TIE 6 (SUTURE) ×1 IMPLANT
SUT V-LOC 90 ABS DVC 3-0 CL (SUTURE) ×3 IMPLANT
SUT VIC AB 1 CT1 36 (SUTURE) ×3 IMPLANT
SYR 20ML LL LF (SYRINGE) ×3 IMPLANT
SYR 30ML LL (SYRINGE) ×6 IMPLANT
SYR 50ML LL SCALE MARK (SYRINGE) ×6 IMPLANT
SYR BULB IRRIG 60ML STRL (SYRINGE) ×3 IMPLANT
TAPE MICROFOAM 4IN (TAPE) ×3 IMPLANT
TOWEL OR 17X26 4PK STRL BLUE (TOWEL DISPOSABLE) ×3 IMPLANT
TRAY FOLEY MTR SLVR 16FR STAT (SET/KITS/TRAYS/PACK) ×3 IMPLANT

## 2019-04-05 NOTE — Evaluation (Signed)
Physical Therapy Evaluation Patient Details Name: Kristy Holt MRN: [id number] DOB: 1940-12-22 Today's Date: 04/05/2019   History of Present Illness  Pt is a 79 y/o F diagnosed with R hip OA and is s/p elective R THA. PMH includes HTN  Clinical Impression  Pt pleasant and motivated to participate during the session. Pt is POD#0 and presented with normal LE sensation and participated with PT to assess bed mobility, exercises, and transfer training. Pt showed appropriate sequencing for bed mobility but needed min assist for trunk control. Pt required cueing for sequencing of R LE positioning with transfers and required consistent reminders to WB through her R LE. Pt's pain and vital responces throughout the session were WNL and in-line with expectations. Pt had poor eccentric control when sitting requiring mod assist despite cueing for foot placement and trunk movement. Pt was essentially performing an eccentric SL squat due to not WB through R LE during stand-to-sits. Pt will benefit from HHPT services upon discharge to safely address deficits listed in patient problem list for decreased caregiver assistance and eventual return to PLOF.       Follow Up Recommendations Home health PT    Equipment Recommendations  3in1 (PT)    Recommendations for Other Services       Precautions / Restrictions Precautions Precautions: Anterior Hip Precaution Booklet Issued: Yes (comment) Precaution Comments: educated on getting out of bed in relation to precautions Restrictions Weight Bearing Restrictions: Yes LLE Weight Bearing: Weight bearing as tolerated      Mobility  Bed Mobility Overal bed mobility: Needs Assistance Bed Mobility: Supine to Sit     Supine to sit: Min assist     General bed mobility comments: demonstrated good sequencing for motion, needed min assist for trunk control  Transfers Overall transfer level: Needs assistance Equipment used: Rolling walker (2  wheeled) Transfers: Sit to/from Stand Sit to Stand: Min guard;Mod assist         General transfer comment: CGA for sit-to-stand, mod assist for stand-to-sit. Poor eccentric control with stand-to-sit. Improved with cueing but required mod assist to prevent from losing control and falling the last few inches to the recliner  Ambulation/Gait Ambulation/Gait assistance: Min guard Gait Distance (Feet): 2 Feet Assistive device: Rolling walker (2 wheeled) Gait Pattern/deviations: Step-to pattern;Decreased stance time - right;Decreased weight shift to right;Antalgic; Gait velocity: decreased   General Gait Details: required cueing to evenly WB through both LE. Took 3-4 small steps at [employer]    Modified Rankin (Stroke Patients Only)       Balance Overall balance assessment: Needs assistance Sitting-balance support: Bilateral upper extremity supported Sitting balance-Leahy Scale: Fair   Postural control: Posterior lean;Left lateral lean Standing balance support: Bilateral upper extremity supported;During functional activity Standing balance-Leahy Scale: Fair Standing balance comment: required cueing to put equal weight through both LE                             Pertinent Vitals/Pain Pain Assessment: 0-10 Pain Score: 4  Pain Descriptors / Indicators: Aching;Sore Pain Intervention(s): Monitored during session;Premedicated before session;Ice applied    Home Living Family/patient expects to be discharged to:: Private residence Living Arrangements: Spouse/significant other;Children Available Help at Discharge: Family;Available 24 hours/day Type of Home: House Home Access: Stairs to enter Entrance Stairs-Rails: Left Entrance Stairs-Number of Steps: 4 Home Layout: One level Home Equipment: Walker - 2  wheels;Grab bars - tub/shower;Cane - quad;Toilet riser      Prior Function Level of Independence: Independent          Comments: Community amb, ind with ADLs, amb w/ quad cane at all times     Hand Dominance        Extremity/Trunk Assessment        Lower Extremity Assessment Lower Extremity Assessment: LLE deficits/detail LLE Deficits / Details: s/p R THA LLE Sensation: WNL       Communication   Communication: No difficulties  Cognition Arousal/Alertness: Awake/alert Behavior During Therapy: WFL for tasks assessed/performed Overall Cognitive Status: Within Functional Limits for tasks assessed                                        General Comments      Exercises Total Joint Exercises Ankle Circles/Pumps: AROM;Strengthening;Both;10 reps Quad Sets: Strengthening;Both;10 reps Gluteal Sets: Strengthening;Both;10 reps Long Arc Quad: AROM;Strengthening;Both;10 reps Marching in Standing: AROM;Strengthening;Both;10 reps Other Exercises Other Exercises: stand-to-sit focusing on eccentric control Other Exercises: education on hand and foot placement/sequencing during transfers Other Exercises: educated on HEP   Assessment/Plan    PT Assessment Patient needs continued PT services  PT Problem List Decreased strength;Decreased mobility;Decreased safety awareness;Decreased range of motion;Decreased knowledge of precautions;Decreased activity tolerance;Decreased balance;Decreased knowledge of use of DME;Pain       PT Treatment Interventions DME instruction;Therapeutic exercise;Gait training;Balance training;Stair training;Functional mobility training;Therapeutic activities    PT Goals (Current goals can be found in the Care Plan section)  Acute Rehab PT Goals Patient Stated Goal: walk without AD PT Goal Formulation: With patient Time For Goal Achievement: 04/18/19 Potential to Achieve Goals: Fair(Likely will require a longer time span for this but she has good potential to improve her ability to ambulate during this timeframe)    Frequency BID   Barriers to discharge         Co-evaluation               AM-PAC PT "6 Clicks" Mobility  Outcome Measure Help needed turning from your back to your side while in a flat bed without using bedrails?: A Little Help needed moving from lying on your back to sitting on the side of a flat bed without using bedrails?: A Little Help needed moving to and from a bed to a chair (including a wheelchair)?: A Lot Help needed standing up from a chair using your arms (e.g., wheelchair or bedside chair)?: A Little Help needed to walk in hospital room?: Total Help needed climbing 3-5 steps with a railing? : Total 6 Click Score: 13    End of Session Equipment Utilized During Treatment: Gait belt Activity Tolerance: Patient tolerated treatment well Patient left: in chair;with call bell/phone within reach;with chair alarm set;with family/visitor present;with SCD's reapplied Nurse Communication: Mobility status;Weight bearing status PT Visit Diagnosis: Unsteadiness on feet (R26.81);Other abnormalities of gait and mobility (R26.89);Muscle weakness (generalized) (M62.81);Pain Pain - Right/Left: Right Pain - part of body: Hip    Time: [id number] PT Time Calculation (min) (ACUTE ONLY): 39 min   Charges:              Annabelle Harman, SPT 04/05/19 5:14 PM

## 2019-04-05 NOTE — Anesthesia Procedure Notes (Addendum)
Spinal  Patient location during procedure: OR Start time: 04/05/2019 10:59 AM End time: 04/05/2019 10:09 AM Staffing Performed: other anesthesia staff  Anesthesiologist: Tera Mater, MD Resident/CRNA: Rolla Plate, CRNA Other anesthesia staff: Reece Agar, RN Preanesthetic Checklist Completed: patient identified, IV checked, site marked, risks and benefits discussed, surgical consent, monitors and equipment checked, pre-op evaluation and timeout performed Spinal Block Patient position: sitting Prep: ChloraPrep and site prepped and draped Patient monitoring: heart rate, continuous pulse ox, blood pressure and cardiac monitor Approach: midline Location: L3-4 Injection technique: single-shot Needle Needle type: Introducer and Pencan  Needle gauge: 24 G Needle length: 9 cm Additional Notes Negative paresthesia. Negative blood return. Positive free-flowing CSF. Expiration date of kit checked and confirmed. Patient tolerated procedure well, without complications.

## 2019-04-05 NOTE — H&P (Signed)
Reviewed paper H+P, will be scanned into chart. No changes noted.  

## 2019-04-05 NOTE — Anesthesia Preprocedure Evaluation (Addendum)
Anesthesia Evaluation  Patient identified by MRN, date of birth, ID band Patient awake    Reviewed: Allergy & Precautions, H&P , NPO status , Patient's Chart, lab work & pertinent test results  Airway Mallampati: III  TM Distance: <3 FB     Dental  (+) Poor Dentition, Missing   Pulmonary neg pulmonary ROS,           Cardiovascular hypertension,      Neuro/Psych negative neurological ROS  negative psych ROS   GI/Hepatic negative GI ROS, Neg liver ROS,   Endo/Other  negative endocrine ROS  Renal/GU      Musculoskeletal   Abdominal   Peds  Hematology negative hematology ROS (+)   Anesthesia Other Findings Past Medical History: No date: Arthritis No date: Hypertension  Past Surgical History: No date: ABDOMINAL HYSTERECTOMY  BMI    Body Mass Index: 26.60 kg/m      Reproductive/Obstetrics negative OB ROS                            Anesthesia Physical Anesthesia Plan  ASA: II  Anesthesia Plan: Spinal   Post-op Pain Management:    Induction:   PONV Risk Score and Plan: Propofol infusion  Airway Management Planned: Natural Airway and Simple Face Mask  Additional Equipment:   Intra-op Plan:   Post-operative Plan:   Informed Consent: I have reviewed the patients History and Physical, chart, labs and discussed the procedure including the risks, benefits and alternatives for the proposed anesthesia with the patient or authorized representative who has indicated his/her understanding and acceptance.     Dental Advisory Given  Plan Discussed with: Anesthesiologist  Anesthesia Plan Comments:         Anesthesia Quick Evaluation

## 2019-04-05 NOTE — Transfer of Care (Signed)
Immediate Anesthesia Transfer of Care Note  Patient: Kristy Holt  Procedure(s) Performed: TOTAL HIP ARTHROPLASTY ANTERIOR APPROACH (Right Hip)  Patient Location: PACU  Anesthesia Type:Spinal  Level of Consciousness: awake and alert   Airway & Oxygen Therapy: Patient Spontanous Breathing and Patient connected to face mask oxygen  Post-op Assessment: Report given to RN and Post -op Vital signs reviewed and stable  Post vital signs: Reviewed  Last Vitals:  Vitals Value Taken Time  BP 111/59 04/05/19 1224  Temp    Pulse 86 04/05/19 1225  Resp 27 04/05/19 1225  SpO2 98 % 04/05/19 1225  Vitals shown include unvalidated device data.  Last Pain:  Vitals:   04/05/19 0831  TempSrc: Tympanic  PainSc: 3       Patients Stated Pain Goal: 0 ([phone])  Complications: No apparent anesthesia complications

## 2019-04-05 NOTE — Op Note (Signed)
04/05/2019  12:26 PM  PATIENT:  [place]  79 y.o. female  PRE-OPERATIVE DIAGNOSIS:  PRIMARY OSTEOARTHRITIS OF RIGHT HIP  POST-OPERATIVE DIAGNOSIS:  PRIMARY OSTEOARTHRITIS OF RIGHT HIP  PROCEDURE:  Procedure(s): TOTAL HIP ARTHROPLASTY ANTERIOR APPROACH (Right)  SURGEON: Laurene Footman, MD  ASSISTANTS: none  ANESTHESIA:   spinal  EBL:  Total I/O In: 1000 [I.V.:1000] Out: 250 [Urine:150; Blood:100]  BLOOD ADMINISTERED:none  DRAINS: Incisional wound VAC   LOCAL MEDICATIONS USED:  MARCAINE   , XYLOCAINE  and OTHER Exparel  SPECIMEN:  Source of Specimen:  Femoral head  DISPOSITION OF SPECIMEN:  PATHOLOGY  COUNTS:  YES  TOURNIQUET:  * No tourniquets in log *  IMPLANTS: Medacta  AMIS cemented 4 standard stem, 56 mm DM Mpact cup and liner with metal S 28 mm head  DICTATION: .Dragon Dictation   The patient was brought to the operating room and after spinal anesthesia was obtained patient was placed on the operative table with the ipsilateral foot into the Medacta attachment, contralateral leg on a well-padded table. C-arm was brought in and preop template x-ray taken. After prepping and draping in usual sterile fashion appropriate patient identification and timeout procedures were completed. Anterior approach to the hip was obtained, with the patient feeling a little bit the proximal incision additional 30 cc of 1% Xylocaine was infiltrated near the incision and centered over the greater trochanter and TFL muscle. The subcutaneous tissue was incised hemostasis being achieved by electrocautery. TFL fascia was incised and the muscle retracted laterally deep retractor placed. The lateral femoral circumflex vessels were identified and ligated. The anterior capsule was exposed and a capsulotomy performed. The neck was identified and a femoral neck cut carried out with a saw using their napkin ring technique removing a section of the head neck to allow better access to the head. 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 Reaming was carried out to 56 mm and a 56 mm cup trial gave appropriate tightness to the acetabular component a 56 DM cup was impacted into position. The leg was then externally rotated and ischiofemoral and pubofemoral releases carried out. The femur was sequentially broached to a size 5, size 5 standard with S head trials were placed and the final components chosen. The 4 cemented stem was inserted along with a S metal 28 mm head and 56 mm liner.  The cement was placed after placing a cement restrictor down the canal size 4 irrigating the canal drying it and pressurizing the cement prior to implantation of the stem the hip was reduced and was stable the wound was thoroughly irrigated with fibrillar placed along the posterior capsule and medial neck. The deep fascia ws closed using a heavy Quill after infiltration of 30 cc of quarter percent Sensorcaine with epinephrine and Exparel being injected throughout the case for postop analgesia.  Marland Kitchen3-0 V-loc to close the skin with skin staples.  Incisional wound VAC applied and patient was sent to recovery in stable condition.   PLAN OF CARE: Admit to inpatient

## 2019-04-06 LAB — CBC
HCT: 32.4 % — ABNORMAL LOW (ref 36.0–46.0)
Hemoglobin: 10.4 g/dL — ABNORMAL LOW (ref 12.0–15.0)
MCH: 29.8 pg (ref 26.0–34.0)
MCHC: 32.1 g/dL (ref 30.0–36.0)
MCV: 92.8 fL (ref 80.0–100.0)
Platelets: 276 10*3/uL (ref 150–400)
RBC: 3.49 MIL/uL — ABNORMAL LOW (ref 3.87–5.11)
RDW: 14.1 % (ref 11.5–15.5)
WBC: 9.1 10*3/uL (ref 4.0–10.5)
nRBC: 0 % (ref 0.0–0.2)

## 2019-04-06 LAB — BASIC METABOLIC PANEL
Anion gap: 6 (ref 5–15)
BUN: 14 mg/dL (ref 8–23)
CO2: 28 mmol/L (ref 22–32)
Calcium: 8.5 mg/dL — ABNORMAL LOW (ref 8.9–10.3)
Chloride: 104 mmol/L (ref 98–111)
Creatinine, Ser: 0.66 mg/dL (ref 0.44–1.00)
GFR calc Af Amer: >60 mL/min (ref >60)
GFR calc non Af Amer: >60 mL/min (ref >60)
Glucose, Bld: 120 mg/dL — ABNORMAL HIGH (ref 70–99)
Potassium: 4 mmol/L (ref 3.5–5.1)
Sodium: 138 mmol/L (ref 135–145)

## 2019-04-06 LAB — SURGICAL PATHOLOGY

## 2019-04-06 NOTE — Progress Notes (Signed)
Physical Therapy Treatment Patient Details Name: Kristy Holt MRN: [id number] DOB: 01/04/1941 Today's Date: 04/06/2019    History of Present Illness Pt is 79 y/o F with PMH: R hip OA and HTN. Pt now s/p elective R THA 04/05/2019.    PT Comments    Pt pleasant and motivated to participate during the session. Pt was able to inc amb distance this afternoon and reported that the effort needed to do so was "medium". After pt ambulated, pt's SPO2 was 86% after her first walk and 87% after her second walk, nsg was notified. Pt was educated on PLB and after 30 seconds to one minute of guided breathing, pt's SPO2 returned to mid 90's on both occasions. Pt continued to struggle with eccentric control of sitting down to a chair likely secondary to fear-avoidence of hip flexion. Pt does not report pain with hip flexion but was reluctant to flex her hip whether it be a SLR, standing marching, or flexing the trunk forward. This is likely due to years of fear avoidence prior to pt's THA as opposed to any mechanical reason. This hypothesis was strengthened after patient did a sit-to-stand (without UE assist per PT request). After struggling for the appropriate strategy, pt then flexed her trunk forward and was able to stand up. Pt will benefit from HHPT services upon discharge to safely address deficits listed in patient problem list for decreased caregiver assistance and eventual return to PLOF.      Follow Up Recommendations  Home health PT     Equipment Recommendations  3in1 (PT)    Recommendations for Other Services       Precautions / Restrictions Precautions Precautions: Anterior Hip;Fall Precaution Booklet Issued: Yes (comment) Restrictions Weight Bearing Restrictions: Yes LLE Weight Bearing: Weight bearing as tolerated    Mobility  Bed Mobility     General bed mobility comments: pt recieved sitting up in chair  Transfers Overall transfer level: Needs assistance Equipment used:  Rolling walker (2 wheeled) Transfers: Sit to/from Stand Sit to Stand: Min guard         General transfer comment: CGA for sit-to-stand, min assist for stand-to-sit. Poor eccentric control with stand-to-sit due to self-limited lack of hip flexion  Ambulation/Gait Ambulation/Gait assistance: Min guard Gait Distance (Feet): 100 Feet x1, 80'x1 Assistive device: Rolling walker (2 wheeled) Gait Pattern/deviations: Decreased step length - left;Decreased stance time - right;Decreased weight shift to right;Antalgic;Step-to pattern;Step-through pattern Gait velocity: decreased   General Gait Details: with cueing, pt was able to swtich from step-to to step-through pattern. This helped improved stance time on the right and step length on the left. Pt desaturated to 86% after her first walk and to 87% after her second walk. SPO2 returned WNL after a therapeutic rest break with PLB- nsg notified.    Stairs             Wheelchair Mobility    Modified Rankin (Stroke Patients Only)       Balance Overall balance assessment: Needs assistance Sitting-balance support: Feet supported Sitting balance-Leahy Scale: Good   Postural control: Posterior lean Standing balance support: Bilateral upper extremity supported;During functional activity Standing balance-Leahy Scale: Fair Standing balance comment: requires UE support through RW and CGA for mobility                            Cognition Arousal/Alertness: Awake/alert Behavior During Therapy: WFL for tasks assessed/performed Overall Cognitive Status: Within Functional Limits for tasks assessed  General Comments: pt was less impulsive this afternoon and waitied for instructions before moving      Exercises Total Joint Exercises Ankle Circles/Pumps: AROM;Strengthening;Both;15 reps Quad Sets: Strengthening;Both;15 reps Gluteal Sets: Strengthening;Both;15 reps Hip  ABduction/ADduction: Both;15 reps;AROM Straight Leg Raises: AAROM;Both;15 reps Long Arc Quad: AROM;Strengthening;Both;15 reps Marching in Standing: AROM;Strengthening;Both;10 reps Other Exercises Other Exercises: trunk flexion training with sitting. Demonstration, verbal, tactile, and visual cueing provided Other Exercises: stand-to-sit training without UE assist to facilitate hip flexion Other Exercises: RW training to encourage rolling the AD rather than picking up and placing it back down    General Comments        Pertinent Vitals/Pain Pain Assessment: 0-10 Pain Score: 0-No pain Pain Location: 0 at rest, 5 with ambulation Pain Descriptors / Indicators: Aching;Sore Pain Intervention(s): Monitored during session;Premedicated before session    Home Living                      Prior Function            PT Goals (current goals can now be found in the care plan section) Progress towards PT goals: Progressing toward goals    Frequency    BID      PT Plan      Co-evaluation              AM-PAC PT "6 Clicks" Mobility   Outcome Measure  Help needed turning from your back to your side while in a flat bed without using bedrails?: A Little Help needed moving from lying on your back to sitting on the side of a flat bed without using bedrails?: A Little Help needed moving to and from a bed to a chair (including a wheelchair)?: A Lot Help needed standing up from a chair using your arms (e.g., wheelchair or bedside chair)?: A Little Help needed to walk in hospital room?: A Little Help needed climbing 3-5 steps with a railing? : Total 6 Click Score: 15    End of Session Equipment Utilized During Treatment: Gait belt Activity Tolerance: Patient tolerated treatment well Patient left: in chair;with call bell/phone within reach;with chair alarm set;with family/visitor present;with SCD's reapplied Nurse Communication: Mobility status;Weight bearing status; SPO2  desat with amb PT Visit Diagnosis: Unsteadiness on feet (R26.81);Other abnormalities of gait and mobility (R26.89);Muscle weakness (generalized) (M62.81);Pain Pain - Right/Left: Right Pain - part of body: Hip     Time: [id number] PT Time Calculation (min) (ACUTE ONLY): 39 min  Charges:  $Gait Training: 8-22 mins $Therapeutic Exercise: 8-22 mins $Therapeutic Activity: 8-22 mins                     Annabelle Harman, SPT 04/06/19 4:36 PM

## 2019-04-06 NOTE — Progress Notes (Signed)
   Subjective: 1 Day Post-Op Procedure(s) (LRB): TOTAL HIP ARTHROPLASTY ANTERIOR APPROACH (Right) Patient reports pain as mild.   Patient is well, and has had no acute complaints or problems Denies any CP, SOB, ABD pain. We will continue therapy today.  Plan is to go Home after hospital stay.  Objective: Vital signs in last 24 hours: Temp:  [96.7 F (35.9 C)-98.5 F (36.9 C)] 98.5 F (36.9 C) (02/10 0736) Pulse Rate:  [73-107] 85 (02/10 0736) Resp:  [10-26] 20 (02/10 0736) BP: (103-177)/(45-73) 117/54 (02/10 0736) SpO2:  [89 %-97 %] 93 % (02/10 0736) FiO2 (%):  [21 %] 21 % (02/09 1418) Weight:  [70.3 kg] 70.3 kg (02/09 0831)  Intake/Output from previous day: 02/09 0701 - 02/10 0700 In: 1060 [P.O.:60; I.V.:1000] Out: 820 [Urine:720; Blood:100] Intake/Output this shift: No intake/output data recorded.  Recent Labs    04/05/19 1421 04/06/19 0258  HGB 12.0 10.4*   Recent Labs    04/05/19 1421 04/06/19 0258  WBC 15.2* 9.1  RBC 3.98 3.49*  HCT 37.0 32.4*  PLT 282 276   Recent Labs    04/05/19 1421 04/06/19 0258  NA  --  138  K  --  4.0  CL  --  104  CO2  --  28  BUN  --  14  CREATININE 0.64 0.66  GLUCOSE  --  120*  CALCIUM  --  8.5*   No results for input(s): LABPT, INR in the last 72 hours.  EXAM General - Patient is Alert, Appropriate and Oriented Extremity - Neurovascular intact Sensation intact distally Intact pulses distally Dorsiflexion/Plantar flexion intact No cellulitis present Compartment soft Dressing - dressing C/D/I and no drainage, prevena intact with out drainage Motor Function - intact, moving foot and toes well on exam.   Past Medical History:  Diagnosis Date  . Arthritis   . Hypertension     Assessment/Plan:   1 Day Post-Op Procedure(s) (LRB): TOTAL HIP ARTHROPLASTY ANTERIOR APPROACH (Right) Active Problems:   Status post total hip replacement, right  Estimated body mass index is 26.6 kg/m as calculated from the  following:   Height as of this encounter: 5\' 4"  (1.626 m).   Weight as of this encounter: 70.3 kg. Advance diet Up with therapy  Needs BM Labs stable Vital signs are stable Pain well controlled Care management to assist with discharge to home with home health PT   DVT Prophylaxis - Lovenox, TED hose and SCDs Weight-Bearing as tolerated to right leg   T. Rachelle Hora, PA-C [facility] 04/06/2019, 8:12 AM

## 2019-04-06 NOTE — Evaluation (Signed)
Occupational Therapy Evaluation Patient Details Name: Kristy Holt MRN: [id number] DOB: 10/12/1940 Today's Date: 04/06/2019    History of Present Illness Pt is 79 y/o F with PMH: R hip OA and HTN. Pt now s/p elective R THA 04/05/2019.   Clinical Impression   Pt was seen for OT evaluation this date POD #1 from above named sx. Prior to hospital admission, pt was Indep with most BADLs-occasionally requiring husband or son's help for LB dressing and using QC at all times for fxl mobility d/t R hip pain. Pt lives with spouse, son and grandson in [facility] with 4 STE. Currently pt demonstrates impairments as described below (See OT problem list) which functionally limit her ability to perform ADL/self-care tasks. Pt currently requires MIN A with ADL transfers, CGA for fxl mobility and static standing grooming tasks with RW for support. In addition, pt requiring MOD A for LB dressing with use of AE introduced this date.  Pt would benefit from skilled OT to address noted impairments and functional limitations (see below for any additional details) in order to maximize safety and independence while minimizing falls risk and caregiver burden. Upon hospital discharge, recommend HHOT to maximize pt safety and return to functional independence during meaningful occupations of daily life.     Follow Up Recommendations  Home health OT;Supervision - Intermittent(supv for fxl mobility)    Equipment Recommendations  Tub/shower seat    Recommendations for Other Services       Precautions / Restrictions Precautions Precautions: Anterior Hip Restrictions Weight Bearing Restrictions: Yes LLE Weight Bearing: Weight bearing as tolerated      Mobility Bed Mobility               General bed mobility comments: pt recieved sitting up in chair  Transfers Overall transfer level: Needs assistance Equipment used: Rolling walker (2 wheeled) Transfers: Sit to/from Stand;Stand Pivot Transfers Sit to Stand:  Min assist Stand pivot transfers: Min assist            Balance Overall balance assessment: Needs assistance Sitting-balance support: Feet supported Sitting balance-Leahy Scale: Good     Standing balance support: Bilateral upper extremity supported;During functional activity Standing balance-Leahy Scale: Fair Standing balance comment: requires UE support through RW and CGA for fxl mobility                           ADL either performed or assessed with clinical judgement   ADL                                         General ADL Comments: Pt requires MOD A with AE for LB dressing in sitting, MIN A for ADL transfers with RW, CGA for standing grooming sink-side with RW, setup for seated UB ADLs.     Vision Baseline Vision/History: Wears glasses Wears Glasses: Reading only Patient Visual Report: No change from baseline       Perception     Praxis      Pertinent Vitals/Pain Pain Assessment: 0-10 Pain Score: 3  Pain Location: 3 at rest, 5 with mobilizing Pain Descriptors / Indicators: Aching;Sore Pain Intervention(s): Limited activity within patient's tolerance;Monitored during session     Hand Dominance     Extremity/Trunk Assessment Upper Extremity Assessment Upper Extremity Assessment: Overall WFL for tasks assessed(bilaterally, grossly 4-/5)   Lower Extremity Assessment Lower Extremity Assessment:  Defer to PT evaluation;RLE deficits/detail RLE: Unable to fully assess due to pain       Communication Communication Communication: No difficulties   Cognition Arousal/Alertness: Awake/alert Behavior During Therapy: WFL for tasks assessed/performed Overall Cognitive Status: Within Functional Limits for tasks assessed                                 General Comments: some minimal impulsivity, required two safety cues as pt tries to stand before OT has walker in front, gait belt on, and IV/wound vac secured.   General  Comments       Exercises Other Exercises Other Exercises: OT facilitates education re: use of AE for LB ADLs. Pt demos moderate understanding, would benefit from f/u training. Other Exercises: OT facilitates education re: Importance of performing ADLs as I'ly as possible, even upon d/c home as pt reports on 2-3 occasions during assessment: "Oh my son or grandson will just do that for me." Pt agreeable. Other Exercises: OT facilitates education re: safety with use of RW including safe hand placement with sit<>stand. Pt demos good understanding.   Shoulder Instructions      Home Living Family/patient expects to be discharged to:: Private residence Living Arrangements: Spouse/significant other;Children Available Help at Discharge: Family;Available 24 hours/day Type of Home: House Home Access: Stairs to enter [employer] of Steps: 4 Entrance Stairs-Rails: Left Home Layout: One level     Bathroom Shower/Tub: Tub/shower unit   Bathroom Toilet: Standard(with riser)     Home Equipment: Environmental consultant - 2 wheels;Grab bars - tub/shower;Cane - quad;Toilet riser          Prior Functioning/Environment Level of Independence: Independent        Comments: Community amb, ind with ADLs, amb w/ quad cane at all times        OT Problem List: Decreased strength;Decreased range of motion;Decreased activity tolerance;Impaired balance (sitting and/or standing);Decreased knowledge of use of DME or AE;Pain      OT Treatment/Interventions: Self-care/ADL training;Therapeutic exercise;DME and/or AE instruction;Therapeutic activities;Patient/family education;Balance training    OT Goals(Current goals can be found in the care plan section) Acute Rehab OT Goals Patient Stated Goal: get around a little easier/with less pain OT Goal Formulation: With patient Time For Goal Achievement: 04/20/19 Potential to Achieve Goals: Good  OT Frequency: Min 1X/week   Barriers to D/C:             Co-evaluation              AM-PAC OT "6 Clicks" Daily Activity     Outcome Measure Help from another person eating meals?: None Help from another person taking care of personal grooming?: A Little Help from another person toileting, which includes using toliet, bedpan, or urinal?: A Little Help from another person bathing (including washing, rinsing, drying)?: A Little Help from another person to put on and taking off regular upper body clothing?: None Help from another person to put on and taking off regular lower body clothing?: A Lot 6 Click Score: 19   End of Session Equipment Utilized During Treatment: Gait belt;Rolling walker Nurse Communication: Mobility status  Activity Tolerance: Patient tolerated treatment well Patient left: in chair;with call bell/phone within reach;with chair alarm set  OT Visit Diagnosis: Unsteadiness on feet (R26.81);Muscle weakness (generalized) (M62.81)                Time: [id number] OT Time Calculation (min): 53 min Charges:  OT General  Charges $OT Visit: 1 Visit OT Evaluation $OT Eval Moderate Complexity: 1 Mod OT Treatments $Self Care/Home Management : 23-37 mins $Therapeutic Activity: 8-22 mins  Gerrianne Scale, MS, OTR/L ascom [phone] 04/06/19, 11:02 AM

## 2019-04-06 NOTE — Progress Notes (Signed)
Physical Therapy Treatment Patient Details Name: Kristy Holt MRN: [id number] DOB: Oct 31, 1940 Today's Date: 04/06/2019    History of Present Illness Pt is 79 y/o F with PMH: R hip OA and HTN. Pt now s/p elective R THA 04/05/2019.    PT Comments    Pt pleasant and motivated to participate during the session. Pt was able to increase ambulation distance this morning and is primarily limited by pain. Pt required cueing to breathe during exercise and desaturated to 88% on RA after both bouts of ambulation. Her SP02 quickly returned to mid 90's after sitting for 30 seconds. Pt required cueing to increase WB through her R LE during transfers and ambulation. When sitting down, pt had poor eccentric control secondary to not flexing her trunk when lowering her body to the chair. When seated, PT worked with pt to facilitate trunk flexion and she was very limited in her ability to bring her trunk forward, even with visual cueing, overpressure, and practice. The cause of limited trunk flexion is unclear and pt was unable to provide any insight as to why she was limited. Given her preference to not WB on her R LE and limited trunk flexion, there is a large demand on her L LE to control her when sitting and her L LE is unable to adequately meet this demand. Future PT sessions will continue to work on inc WB tolerance along with continuing to promote trunk flexion during transfers. Pt will benefit from HHPT services upon discharge to safely address deficits listed in patient problem list for decreased caregiver assistance and eventual return to PLOF.      Follow Up Recommendations  Home health PT     Equipment Recommendations  3in1 (PT)    Recommendations for Other Services       Precautions / Restrictions Precautions Precautions: Anterior Hip;Fall Precaution Booklet Issued: Yes (comment) Restrictions Weight Bearing Restrictions: Yes LLE Weight Bearing: Weight bearing as tolerated    Mobility  Bed Mobility               General bed mobility comments: pt recieved sitting up in chair  Transfers Overall transfer level: Needs assistance Equipment used: Rolling walker (2 wheeled) Transfers: Sit to/from Stand Sit to Stand: Min guard Stand pivot transfers: Min assist       General transfer comment: CGA for sit-to-stand, mod assist for stand-to-sit. Poor eccentric control with stand-to-sit due to inability/unwillingness to flex trunk fwd  Ambulation/Gait Ambulation/Gait assistance: Min guard Gait Distance (Feet): 50 Feet Assistive device: Rolling walker (2 wheeled) Gait Pattern/deviations: Decreased step length - left;Decreased stance time - right;Decreased stride length;Decreased weight shift to right;Antalgic;Step-to pattern Gait velocity: decreased   General Gait Details: required cueing to evenly WB through both LE. Pt was TTWB before cueing to WB through the whole R foot   Stairs             Wheelchair Mobility    Modified Rankin (Stroke Patients Only)       Balance Overall balance assessment: Needs assistance Sitting-balance support: Feet supported Sitting balance-Leahy Scale: Good   Postural control: Posterior lean Standing balance support: Bilateral upper extremity supported;During functional activity Standing balance-Leahy Scale: Fair Standing balance comment: requires UE support through RW and CGA for mobility                            Cognition Arousal/Alertness: Awake/alert Behavior During Therapy: WFL for tasks assessed/performed Overall Cognitive Status: Within  Functional Limits for tasks assessed                                 General Comments: some minimal impulsivity, required multiple safety cues as pt tries to stand before PT has walker in front, gait belt on, and IV/wound vac secured.      Exercises Total Joint Exercises Ankle Circles/Pumps: AROM;Strengthening;Both;15 reps Quad Sets:  Strengthening;Both;15 reps Gluteal Sets: Strengthening;Both;15 reps Towel Squeeze: Strengthening;Both;15 reps Hip ABduction/ADduction: Both;15 reps;AROM Straight Leg Raises: AAROM;Both;15 reps Long Arc Quad: AROM;Strengthening;Both;15 reps Marching in Standing: AROM;Strengthening;Both;10 reps Other Exercises Other Exercises: trunk flexion training with sitting. Demonstration, verbal, tactile, and visual cueing provided Other Exercises: stand-to-sit training Other Exercises: Even WB training during amb    General Comments        Pertinent Vitals/Pain Pain Assessment: 0-10 Pain Score: 3  Pain Location: 3 at rest Pain Descriptors / Indicators: Aching;Sore Pain Intervention(s): Monitored during session;Premedicated before session    Home Living       Prior Function    PT Goals (current goals can now be found in the care plan section) Acute Rehab PT Goals Progress towards PT goals: Progressing toward goals    Frequency    BID      PT Plan      Co-evaluation              AM-PAC PT "6 Clicks" Mobility   Outcome Measure  Help needed turning from your back to your side while in a flat bed without using bedrails?: A Little Help needed moving from lying on your back to sitting on the side of a flat bed without using bedrails?: A Little Help needed moving to and from a bed to a chair (including a wheelchair)?: A Lot Help needed standing up from a chair using your arms (e.g., wheelchair or bedside chair)?: A Little Help needed to walk in hospital room?: A Little Help needed climbing 3-5 steps with a railing? : Total 6 Click Score: 15    End of Session Equipment Utilized During Treatment: Gait belt Activity Tolerance: Patient tolerated treatment well Patient left: in chair;with call bell/phone within reach;with chair alarm set;with family/visitor present;with SCD's reapplied Nurse Communication: Mobility status;Weight bearing status PT Visit Diagnosis: Unsteadiness  on feet (R26.81);Other abnormalities of gait and mobility (R26.89);Muscle weakness (generalized) (M62.81);Pain Pain - Right/Left: Right Pain - part of body: Hip     Time: [id number] PT Time Calculation (min) (ACUTE ONLY): 38 min  Charges:                        Annabelle Harman, SPT 04/06/19 12:58 PM

## 2019-04-06 NOTE — Anesthesia Postprocedure Evaluation (Signed)
Anesthesia Post Note  Patient: [place]  Procedure(s) Performed: TOTAL HIP ARTHROPLASTY ANTERIOR APPROACH (Right Hip)  Patient location during evaluation: Nursing Unit Anesthesia Type: Spinal Level of consciousness: awake, awake and alert, patient cooperative and oriented Pain management: pain level controlled Vital Signs Assessment: post-procedure vital signs reviewed and stable Respiratory status: spontaneous breathing, nonlabored ventilation and respiratory function stable Cardiovascular status: stable Postop Assessment: no headache, no backache, patient able to bend at knees, no apparent nausea or vomiting and adequate PO intake Anesthetic complications: no     Last Vitals:  Vitals:   04/06/19 0240 04/06/19 0629  BP: (!) 106/55 128/60  Pulse: 87 92  Resp:  17  Temp:  36.5 C  SpO2:  92%    Last Pain:  Vitals:   04/06/19 0629  TempSrc: Oral  PainSc:                  Ricki Miller

## 2019-04-06 NOTE — Progress Notes (Signed)
MEWS Guidelines - (patients age 79 and over)  HR 104, BP 141/63, MD Poggi notified d/t acute change in VS. See new orders. Pt asymptomatic, will continue to monitor per MEWS protocol  Yellow - At risk for Deterioration  1. Go to room and assess patient 2. Validate data. Is this patient's baseline? If data confirmed: 3. Is this an acute change? 4. Administer prn meds/treatments as ordered? 5. Note Sepsis score 6. Review goals of care 7. Sports coach and Provider 8. Call RRT nurse as needed. 9. Document patient condition/interventions/response. 10. Increase frequency of vital signs and focused assessments to at least q2h x2. - If stable, then q4h x2 and then q8h or dept. routine. - If unstable, contact Provider & RRT nurse. Prepare for possible transfer. 11. Add entry in progress notes using the smart phrase ".MEWS".

## 2019-04-06 NOTE — TOC Benefit Eligibility Note (Signed)
Transition of Care (TOC) Benefit Eligibility Note    Patient Details  Name: Kristy Holt MRN: [id number] Date of Birth: 08/12/1940   Medication/Dose: Enoxaparin 40mg once daily for 14 days  Covered?: Yes  Prescription Coverage Preferred Pharmacy: [facility]  Spoke with Person/Company/Phone Number:: Dayam/Sheryl with [employer] at [phone]  Co-Pay: $95.00 estimated copay  Prior Approval: No  Deductible: (No deductible on plan.  $3,900 out of pocket max, $25 met as ot time of call.)   [name] Phone Number: [phone] or [phone] 04/06/2019, 10:36 AM     

## 2019-04-06 NOTE — TOC Initial Note (Signed)
Transition of Care [facility]) - Initial/Assessment Note    Patient Details  Name: Kristy Holt MRN: [id number] Date of Birth: Oct 04, 1940  Transition of Care [facility]) CM/SW Contact:    Su Hilt, RN Phone Number: 04/06/2019, 12:18 PM  Clinical Narrative:                 Met with the patient's husband and the patient at the bedside, she lives at home with her husband and has her son and grandson there to help as well, has a RW, a quad cane and a toilet riser at home and has no other DME needs, she has been set up with [facility] services, her lovenox is $95 copay and she states that she can afford the medications, she is up to date with her PCP, and has transportation with spouse and son,, No additional needs at this time  Expected Discharge Plan: [place] Barriers to Discharge: Continued Medical Work up   Patient Goals and CMS Choice Patient states their goals for this hospitalization and ongoing recovery are:: go home      Expected Discharge Plan and Services Expected Discharge Plan: [place]   Discharge Planning Services: CM Consult   Living arrangements for the past 2 months: [place]                 DME Arranged: N/A, Ostomy supplies         HH Arranged: PT [place] Agency: [facility] [employer] [facility] [employer] Date [place]: 04/06/19 Time [place]: 1217 Representative spoke with at [place]: Helene Kelp  Prior Living Arrangements/Services Living arrangements for the past 2 months: [place] Lives with:: Spouse, Adult Children, Relatives Patient language and need for interpreter reviewed:: Yes Do you feel safe going back to the place where you live?: Yes      Need for Family Participation in Patient Care: No (Comment) Care giver support system in place?: Yes (comment) Current home services: DME(RW, Toilet Riser, Quad cane) Criminal Activity/Legal Involvement  Pertinent to Current Situation/Hospitalization: No - Comment as needed  Activities of Daily Living Home Assistive Devices/Equipment: Blood pressure cuff, Walker (specify type), Other (Comment)(Partials upper lower) ADL Screening (condition at time of admission) Patient's cognitive ability adequate to safely complete daily activities?: Yes Is the patient deaf or have difficulty hearing?: No Does the patient have difficulty seeing, even when wearing glasses/contacts?: No Does the patient have difficulty concentrating, remembering, or making decisions?: No Patient able to express need for assistance with ADLs?: Yes Does the patient have difficulty dressing or bathing?: No Independently performs ADLs?: Yes (appropriate for developmental age) Does the patient have difficulty walking or climbing stairs?: Yes Weakness of Legs: Left Weakness of Arms/Hands: None  Permission Sought/Granted   Permission granted to share information with : Yes, Verbal Permission Granted              Emotional Assessment Appearance:: Appears stated age Attitude/Demeanor/Rapport: Engaged Affect (typically observed): Appropriate Orientation: : Oriented to Self, Oriented to Situation, Oriented to  Time Alcohol / Substance Use: Not Applicable Psych Involvement: No (comment)  Admission diagnosis:  Status post total hip replacement, right [X83.382] Patient Active Problem List   Diagnosis Date Noted  . Status post total hip replacement, right 04/05/2019   PCP:  Idelle Crouch, MD Pharmacy:   [place], [place] [address] [place] [address]  Phone: [phone] Fax: [phone]     Social Determinants of Health (SDOH) Interventions    Readmission Risk Interventions No flowsheet data found.

## 2019-04-07 DIAGNOSIS — Z01818 Encounter for other preprocedural examination: Secondary | ICD-10-CM | POA: Diagnosis not present

## 2019-04-07 LAB — CBC
HCT: 30 % — ABNORMAL LOW (ref 36.0–46.0)
Hemoglobin: 9.7 g/dL — ABNORMAL LOW (ref 12.0–15.0)
MCH: 30.3 pg (ref 26.0–34.0)
MCHC: 32.3 g/dL (ref 30.0–36.0)
MCV: 93.8 fL (ref 80.0–100.0)
Platelets: 241 10*3/uL (ref 150–400)
RBC: 3.2 MIL/uL — ABNORMAL LOW (ref 3.87–5.11)
RDW: 14.1 % (ref 11.5–15.5)
WBC: 9 10*3/uL (ref 4.0–10.5)
nRBC: 0 % (ref 0.0–0.2)

## 2019-04-07 MED ORDER — HYDROCODONE-ACETAMINOPHEN 5-325 MG PO TABS: 1.0000 | ORAL_TABLET | ORAL | 0 refills | Status: AC | PRN

## 2019-04-07 MED ORDER — DOCUSATE SODIUM 100 MG PO CAPS: 100.0000 mg | ORAL_CAPSULE | Freq: Two times a day (BID) | ORAL | 0 refills | Status: AC

## 2019-04-07 MED ORDER — ENOXAPARIN SODIUM 40 MG/0.4ML ~~LOC~~ SOLN: 40.0000 mg | SUBCUTANEOUS | 0 refills | Status: AC

## 2019-04-07 NOTE — Discharge Summary (Signed)
Physician Discharge Summary  Patient ID: Kristy Holt MRN: [id number] DOB/AGE: [birth year] 79 y.o.  Admit date: 04/05/2019 Discharge date: 04/07/2019  Admission Diagnoses:  Status post total hip replacement, right [Z96.641]   Discharge Diagnoses: Patient Active Problem List   Diagnosis Date Noted  . Status post total hip replacement, right 04/05/2019    Past Medical History:  Diagnosis Date  . Arthritis   . Hypertension      Transfusion: none   Consultants (if any):   Discharged Condition: Improved  Hospital Course: Kristy Holt is an 79 y.o. female who was admitted 04/05/2019 with a diagnosis of right hip osteoarthritis and went to the operating room on 04/05/2019 and underwent the above named procedures.    Surgeries: Procedure(s): TOTAL HIP ARTHROPLASTY ANTERIOR APPROACH on 04/05/2019 Patient tolerated the surgery well. Taken to PACU where she was stabilized and then transferred to the orthopedic floor.  Started on Lovenox 40 mg  q 24 hrs. Foot pumps applied bilaterally at 80 mm. Heels elevated on bed with rolled towels. No evidence of DVT. Negative [place]. Physical therapy started on day #1 for gait training and transfer. OT started day #1 for ADL and assisted devices.  Patient's foley was d/c on day #1. Patient's IV was d/c on day #2.  On post op day #2 patient was stable and ready for discharge to home with HHPT.  Implants: Medacta  AMIS cemented 4 standard stem, 56 mm DM Mpact cup and liner with metal S 28 mm head  She was given perioperative antibiotics:  Anti-infectives (From admission, onward)   Start     Dose/Rate Route Frequency Ordered Stop   04/05/19 1430  ceFAZolin (ANCEF) IVPB 1 g/50 mL premix     1 g 100 mL/hr over 30 Minutes Intravenous Every 6 hours 04/05/19 1417 04/06/19 0400   04/05/19 0600  ceFAZolin (ANCEF) IVPB 2g/100 mL premix     2 g 200 mL/hr over 30 Minutes Intravenous On call to O.R. 04/04/19 2236 04/05/19 1027    .  She was given  sequential compression devices, early ambulation, and Lovenox, TEDs for DVT prophylaxis.  She benefited maximally from the hospital stay and there were no complications.    Recent vital signs:  Vitals:   04/07/19 0802 04/07/19 0808  BP: 135/74 135/74  Pulse: 93 90  Resp:  16  Temp:  (!) 97.4 F (36.3 C)  SpO2:  93%    Recent laboratory studies:  Lab Results  Component Value Date   HGB 9.7 (L) 04/07/2019   HGB 10.4 (L) 04/06/2019   HGB 12.0 04/05/2019   Lab Results  Component Value Date   WBC 9.0 04/07/2019   PLT 241 04/07/2019   No results found for: INR Lab Results  Component Value Date   NA 138 04/06/2019   K 4.0 04/06/2019   CL 104 04/06/2019   CO2 28 04/06/2019   BUN 14 04/06/2019   CREATININE 0.66 04/06/2019   GLUCOSE 120 (H) 04/06/2019    Discharge Medications:   Allergies as of 04/07/2019   Not on File     Medication List    TAKE these medications   diphenhydramine-acetaminophen 25-500 MG Tabs tablet Commonly known as: TYLENOL PM Take 1 tablet by mouth at bedtime as needed. Notes to patient: Not given in hospital   docusate sodium 100 MG capsule Commonly known as: COLACE Take 1 capsule (100 mg total) by mouth 2 (two) times daily.   enoxaparin 40 MG/0.4ML injection Commonly known as:  LOVENOX Inject 0.4 mLs (40 mg total) into the skin daily for 14 days. Start taking on: April 08, 2019   HYDROcodone-acetaminophen 5-325 MG tablet Commonly known as: NORCO/VICODIN Take 1-2 tablets by mouth every 4 (four) hours as needed for moderate pain (pain score 4-6).   hydrOXYzine 25 MG tablet Commonly known as: ATARAX/VISTARIL Take 25 mg by mouth 3 (three) times daily as needed for itching. Notes to patient: Not given in hospital   lisinopril 20 MG tablet Commonly known as: ZESTRIL Take 20 mg by mouth 2 (two) times daily. Notes to patient: Not given in hospital   triamcinolone cream 0.5 % Commonly known as: KENALOG Apply 1 application topically 3  (three) times daily. Notes to patient: Not given in hospital            [place]  (From admission, onward)         Start     Ordered   04/05/19 1418  DME Walker rolling  Once    Question Answer Comment  Walker: With 5 Inch Wheels   Patient needs a walker to treat with the following condition Status post total hip replacement, right      04/05/19 1417   04/05/19 1418  DME 3 n 1  Once     04/05/19 1417   04/05/19 1418  DME Bedside commode  Once    Question:  Patient needs a bedside commode to treat with the following condition  Answer:  Status post total hip replacement, right   04/05/19 1417          Diagnostic Studies: CT HIP RIGHT WO CONTRAST  Result Date: 03/31/2019 CLINICAL DATA:  Right hip pain for 2 years EXAM: CT OF THE RIGHT HIP WITHOUT CONTRAST TECHNIQUE: Multidetector CT imaging of the right hip was performed according to the standard protocol. Multiplanar CT image reconstructions were also generated. COMPARISON:  None. FINDINGS: Bones/Joint/Cartilage Severe degenerative changes of the right hip joint with marked bone loss and remodeling of the femoral head and acetabulum. Extensive subchondral sclerosis and subchondral cystic changes. Complete joint space loss with axial migration of the femoral head and severe acetabular protrusio deformity. Medial acetabular wall is markedly thinned with areas of discontinuity. Small to moderate-sized joint effusion and/or synovial thickening. Remaining osseous structures are intact without fracture. Visualized SI joint is intact. Pubic symphysis intact with mild degenerative changes. Degenerative disc disease of L5-S1. Ligaments Suboptimally assessed by CT. Muscles and Tendons No evidence of high-grade tendinous abnormality. No intramuscular fluid collection. Soft tissues No soft tissue fluid collection or hematoma. No right inguinal lymphadenopathy. Visualized portion of the right hemipelvis demonstrates no acute  findings. There is no pelvic sidewall hematoma. IMPRESSION: Severe degenerative changes of the right hip joint with acetabular protrusio deformity. Medial acetabular wall is markedly thinned with areas of discontinuity, favored chronic. No associated pelvic sidewall hematoma. Small to moderate-sized joint effusion and/or synovial thickening. Electronically Signed   By: Davina Poke D.O.   On: 03/31/2019 13:41   MM 3D SCREEN BREAST BILATERAL  Result Date: 03/14/2019 CLINICAL DATA:  Screening. EXAM: DIGITAL SCREENING BILATERAL MAMMOGRAM WITH TOMO AND CAD COMPARISON:  Previous exam(s). ACR Breast Density Category b: There are scattered areas of fibroglandular density. FINDINGS: There are no findings suspicious for malignancy. Images were processed with CAD. IMPRESSION: No mammographic evidence of malignancy. A result letter of this screening mammogram will be mailed directly to the patient. RECOMMENDATION: Screening mammogram in one year. (Code:SM-B-01Y) BI-RADS CATEGORY  1: Negative. Electronically Signed  By: Lillia Mountain M.D.   On: 03/14/2019 13:06   DG HIP OPERATIVE UNILAT W OR W/O PELVIS RIGHT  Result Date: 04/05/2019 CLINICAL DATA:  Right hip replacement. EXAM: OPERATIVE right HIP (WITH PELVIS IF PERFORMED) 2 VIEWS TECHNIQUE: Fluoroscopic spot image(s) were submitted for interpretation post-operatively. Radiation exposure index: 15.6 mGy. COMPARISON:  None. FINDINGS: Two intraoperative fluoroscopic images demonstrate the placement right femoral and acetabular components. They appear to be well situated. IMPRESSION: Fluoroscopic guidance provided during right total hip arthroplasty. Electronically Signed   By: Marijo Conception M.D.   On: 04/05/2019 12:39   DG HIP UNILAT W OR W/O PELVIS 2-3 VIEWS RIGHT  Result Date: 04/05/2019 CLINICAL DATA:  Status post right hip replacement EXAM: DG HIP (WITH OR WITHOUT PELVIS) 2-3V RIGHT COMPARISON:  Film from earlier in the same day. FINDINGS: Right hip prosthesis  is noted in satisfactory position. Surgical drain is noted in place. No acute bony or soft tissue abnormality is noted. IMPRESSION: Status post right hip replacement Electronically Signed   By: Inez Catalina M.D.   On: 04/05/2019 13:00    Disposition:     Follow-up Information    Duanne Guess, PA-C Follow up in 2 week(s).   Specialties: Orthopedic Surgery, Emergency Medicine Contact information: [place] [address] [phone]            Signed: Feliberto Gottron 04/07/2019, 10:16 AM

## 2019-04-07 NOTE — Discharge Instructions (Signed)

## 2019-04-07 NOTE — Progress Notes (Signed)
Discharge instructions reviewed with pt and husband they expressed understanding.  Wound Vac tubing was disconnected and reconnected to pravena vac, pt and husband expressed understanding on when to remove and apply honeycomb dressing.  All belongings were packed and pt was wheeled down to [place] by nurse tech and assisted in to car.

## 2019-04-07 NOTE — Progress Notes (Signed)
Occupational Therapy Treatment Patient Details Name: Kristy Holt MRN: [id number] DOB: 11-22-1940 Today's Date: 04/07/2019    History of present illness Pt is 79 y/o F with PMH: R hip OA and HTN. Pt now s/p elective R THA 04/05/2019.   OT comments  Pt seen for OT tx this date to f/u re: LB dressing ADLs with AE including sock aid, reacher, and LH shoehorn. Pt tolerates well, required extended time and MIN/MOD A with sit/lateral lean technique while seated in recliner. Husband present throughout. Education provided re: importance of performing as many ADLs I'ly or MOD I as possible to maintain independence and fxl level. Both parties verbalize understanding. HHOT remains most appropriate d/c disposition.    Follow Up Recommendations  Home health OT;Supervision - Intermittent    Equipment Recommendations  Tub/shower seat    Recommendations for Other Services      Precautions / Restrictions Precautions Precautions: Anterior Hip;Fall Precaution Booklet Issued: Yes (comment) Restrictions Weight Bearing Restrictions: Yes LLE Weight Bearing: Weight bearing as tolerated       Mobility Bed Mobility               General bed mobility comments: pt recieved sitting up in chair  Transfers                      Balance                                           ADL either performed or assessed with clinical judgement   ADL                                         General ADL Comments: Pt requires MIN/MOD A with sit/lateral lean technique to participate in AE education re: use of sock aid, reacher, and LH shoehorn to thread/doff socks and don/doff slip on shoes. Pt and husband verbalize and demo good understanding. Pt would benefit from f/u in home setting to improve independence.     Vision Baseline Vision/History: Wears glasses Wears Glasses: Reading only Patient Visual Report: No change from baseline     Perception      Praxis      Cognition Arousal/Alertness: Awake/alert Behavior During Therapy: WFL for tasks assessed/performed Overall Cognitive Status: Within Functional Limits for tasks assessed                                          Exercises Other Exercises Other Exercises: OT facilitates pt participation in seated LB modified dressing training, see ADL section. Extended time.   Shoulder Instructions       General Comments      Pertinent Vitals/ Pain       Pain Assessment: Faces Faces Pain Scale: Hurts little more Pain Location: 4 with activity Pain Descriptors / Indicators: Aching;Sore Pain Intervention(s): Monitored during session  Home Living                                          Prior Functioning/Environment  Frequency  Min 1X/week        Progress Toward Goals  OT Goals(current goals can now be found in the care plan section)  Progress towards OT goals: Progressing toward goals  Acute Rehab OT Goals Patient Stated Goal: get around a little easier/with less pain OT Goal Formulation: With patient Time For Goal Achievement: 04/20/19 Potential to Achieve Goals: Good  Plan Discharge plan remains appropriate;Frequency remains appropriate    Co-evaluation                 AM-PAC OT "6 Clicks" Daily Activity     Outcome Measure   Help from another person eating meals?: None Help from another person taking care of personal grooming?: A Little Help from another person toileting, which includes using toliet, bedpan, or urinal?: A Little Help from another person bathing (including washing, rinsing, drying)?: A Little Help from another person to put on and taking off regular upper body clothing?: None Help from another person to put on and taking off regular lower body clothing?: A Lot 6 Click Score: 19    End of Session    OT Visit Diagnosis: Unsteadiness on feet (R26.81);Muscle weakness (generalized)  (M62.81)   Activity Tolerance Patient tolerated treatment well   Patient Left in chair;with call bell/phone within reach;with chair alarm set;with family/visitor present(husband present)   Nurse Communication Mobility status        Time: [id number] OT Time Calculation (min): 34 min  Charges: OT General Charges $OT Visit: 1 Visit OT Treatments $Self Care/Home Management : 23-37 mins  Gerrianne Scale, MS, OTR/L ascom [phone] 04/07/19, 12:11 PM

## 2019-04-07 NOTE — Progress Notes (Signed)
   Subjective: 2 Days Post-Op Procedure(s) (LRB): TOTAL HIP ARTHROPLASTY ANTERIOR APPROACH (Right) Patient reports pain as mild.   Patient is well, and has had no acute complaints or problems Denies any CP, SOB, ABD pain. We will continue therapy today.  Plan is to go Home after hospital stay.  Objective: Vital signs in last 24 hours: Temp:  [97.7 F (36.5 C)-98.5 F (36.9 C)] 97.7 F (36.5 C) (02/10 2345) Pulse Rate:  [75-88] 88 (02/10 2345) Resp:  [18-20] 18 (02/10 2345) BP: (117-133)/(53-58) 128/58 (02/10 2345) SpO2:  [90 %-93 %] 90 % (02/10 2345)  Intake/Output from previous day: 02/10 0701 - 02/11 0700 In: 240 [P.O.:240] Out: 125 [Urine:125] Intake/Output this shift: No intake/output data recorded.  Recent Labs    04/05/19 1421 04/06/19 0258 04/07/19 0416  HGB 12.0 10.4* 9.7*   Recent Labs    04/06/19 0258 04/07/19 0416  WBC 9.1 9.0  RBC 3.49* 3.20*  HCT 32.4* 30.0*  PLT 276 241   Recent Labs    04/05/19 1421 04/06/19 0258  NA  --  138  K  --  4.0  CL  --  104  CO2  --  28  BUN  --  14  CREATININE 0.64 0.66  GLUCOSE  --  120*  CALCIUM  --  8.5*   No results for input(s): LABPT, INR in the last 72 hours.  EXAM General - Patient is Alert, Appropriate and Oriented Extremity - Neurovascular intact Sensation intact distally Intact pulses distally Dorsiflexion/Plantar flexion intact No cellulitis present Compartment soft Dressing - dressing C/D/I and no drainage, prevena intact with out drainage Motor Function - intact, moving foot and toes well on exam.   Past Medical History:  Diagnosis Date  . Arthritis   . Hypertension     Assessment/Plan:   2 Days Post-Op Procedure(s) (LRB): TOTAL HIP ARTHROPLASTY ANTERIOR APPROACH (Right) Active Problems:   Status post total hip replacement, right  Estimated body mass index is 26.6 kg/m as calculated from the following:   Height as of this encounter: 5\' 4"  (1.626 m).   Weight as of this  encounter: 70.3 kg. Advance diet Up with therapy  Needs BM Labs stable Vital signs are stable Pain well controlled Care management to assist with discharge to home with home health PT today   DVT Prophylaxis - Lovenox, TED hose and SCDs Weight-Bearing as tolerated to right leg   T. Rachelle Hora, PA-C [place] 04/07/2019, 7:24 AM

## 2019-04-07 NOTE — Progress Notes (Signed)
Physical Therapy Treatment Patient Details Name: Kristy Holt MRN: [id number] DOB: 06/30/1940 Today's Date: 04/07/2019    History of Present Illness Pt is 79 y/o F with PMH: R hip OA and HTN. Pt now s/p elective R THA 04/05/2019.    PT Comments    Pt pleasant and motivated to participate during the session. Pt continued to struggle with eccentric control during stand-to-sit transfers secondary to an inability to flex the hip enough to bring the trunk forward. With pt in a reclined position, AAROM for hip flexion was attempted and pt was grossly limited by 50-75% compared to the contralateral side. This lack of hip flexion increases the difficulty with completing stairs, but pt was able to go up and down 4 stairs with one railing this morning with a decreased speed and increased effort. Pt also continues to have decreases in O2 sat following activity. After walking, her SPO2 was 88% and after stair training, her SPO2 was 92%. At rest, her SPO2 is in the mid-90's. Pt was educated on breathing strategies, transfers, and stair training today and demonstrated expressed understanding.  Pt was educated on car transfers and verbalized understanding. Pt will benefit from HHPT services upon discharge to safely address deficits listed in patient problem list for decreased caregiver assistance and eventual return to PLOF.      Follow Up Recommendations  Home health PT     Equipment Recommendations  3in1 (PT)    Recommendations for Other Services       Precautions / Restrictions Precautions Precautions: Anterior Hip;Fall Precaution Booklet Issued: Yes (comment) Restrictions Weight Bearing Restrictions: Yes LLE Weight Bearing: Weight bearing as tolerated    Mobility  Bed Mobility               General bed mobility comments: pt recieved sitting up in chair and ended session in the chair  Transfers Overall transfer level: Needs assistance Equipment used: Rolling walker (2  wheeled) Transfers: Sit to/from Stand Sit to Stand: Min guard;Min assist         General transfer comment: CGA for sit-to-stand, min assist for stand-to-sit. Poor eccentric control with stand-to-sit due to lack of hip flexion  Ambulation/Gait Ambulation/Gait assistance: Min guard Gait Distance (Feet): 100 Feet Assistive device: Rolling walker (2 wheeled) Gait Pattern/deviations: Decreased step length - left;Decreased stance time - right;Decreased weight shift to right;Antalgic;Step-through pattern Gait velocity: decreased   General Gait Details: Before any verbal cueing, pt weight shifted to her L side and avoided WB through the R LE, had a step-to pattern, and was picking up up the RW with each step. After verbal cueing, pt WB through both LE evenly, had a step-through pattern, and slide the RW   Stairs Stairs: Yes Stairs assistance: +2 safety/equipment;Min guard Stair Management: One rail Left;Step to pattern;Forwards Number of Stairs: 4 General stair comments: Required verbal cueing throughout and expressed fatigue afterwards. Going down appeared to be more difficult. At home, pt requires assistance from husband for guarding and assistance with the RW. Pt and spouse education/training provided during session and both verbalized feeling comfortable with doing stairs at home upon D/C   Wheelchair Mobility    Modified Rankin (Stroke Patients Only)       Balance Overall balance assessment: Needs assistance Sitting-balance support: Feet supported Sitting balance-Leahy Scale: Good   Postural control: Posterior lean Standing balance support: Bilateral upper extremity supported;During functional activity Standing balance-Leahy Scale: Fair Standing balance comment: requires UE support through RW and CGA for mobility  Cognition Arousal/Alertness: Awake/alert Behavior During Therapy: WFL for tasks assessed/performed Overall Cognitive  Status: Within Functional Limits for tasks assessed                                 General Comments: pt was less impulsive this morning compared to previous sessions and waitied for instructions before transferring and ambulating      Exercises Total Joint Exercises Ankle Circles/Pumps: AROM;Strengthening;Both;15 reps Quad Sets: Strengthening;Both;15 reps Marching in Standing: AROM;Strengthening;Both;10 reps(had difficulty with WS and active march) Other Exercises Other Exercises: AAROM hip flexion to facilitate inc ROM Other Exercises: stand-to-sit training without UE assist to facilitate hip flexion Other Exercises: RW training  Other Exercises: verbally and visually simulated car transfers    General Comments        Pertinent Vitals/Pain Pain Assessment: 0-10 Pain Score: 2  Faces Pain Scale: Hurts little more Pain Location: R hip Pain Descriptors / Indicators: Aching;Sore Pain Intervention(s): Monitored during session;Premedicated before session    Home Living                      Prior Function            PT Goals (current goals can now be found in the care plan section) Acute Rehab PT Goals Patient Stated Goal: get around a little easier/with less pain Progress towards PT goals: Progressing toward goals    Frequency    BID      PT Plan Current plan remains appropriate    Co-evaluation              AM-PAC PT "6 Clicks" Mobility   Outcome Measure  Help needed turning from your back to your side while in a flat bed without using bedrails?: A Little Help needed moving from lying on your back to sitting on the side of a flat bed without using bedrails?: A Little Help needed moving to and from a bed to a chair (including a wheelchair)?: A Lot Help needed standing up from a chair using your arms (e.g., wheelchair or bedside chair)?: A Little Help needed to walk in hospital room?: A Little Help needed climbing 3-5 steps with a  railing? : A Lot 6 Click Score: 16    End of Session Equipment Utilized During Treatment: Gait belt Activity Tolerance: Patient tolerated treatment well Patient left: in chair;with call bell/phone within reach;with family/visitor present;with SCD's reapplied Nurse Communication: Mobility status;Weight bearing status PT Visit Diagnosis: Unsteadiness on feet (R26.81);Other abnormalities of gait and mobility (R26.89);Muscle weakness (generalized) (M62.81);Pain Pain - Right/Left: Right Pain - part of body: Hip     Time: [id number] PT Time Calculation (min) (ACUTE ONLY): 38 min  Charges:                        Annabelle Harman, SPT 04/07/19 1:34 PM

## 2019-04-07 NOTE — TOC Transition Note (Signed)
Transition of Care [facility]) - CM/SW Discharge Note   Patient Details  Name: Kristy Holt MRN: [id number] Date of Birth: Oct 24, 1940  [facility] CM/SW Contact:  Su Hilt, RN Phone Number: 04/07/2019, 11:47 AM   Clinical Narrative:     Patient is to DC home today She is set up with [facility] for [facility] and has DME at home, NO additonal needs  Final next level of care: Home w Home Health Services Barriers to Discharge: Barriers Resolved   Patient Goals and CMS Choice Patient states their goals for this hospitalization and ongoing recovery are:: go home      Discharge Placement                       Discharge Plan and Services   Discharge Planning Services: CM Consult            DME Arranged: N/A, Ostomy supplies         HH Arranged: PT [place] Agency: [facility] (formerly [employer]) Date [place]: 04/06/19 Time [place]: 1217 Representative spoke with at [place]: [place] Interventions     Readmission Risk Interventions No flowsheet data found.

## 2019-04-08 DIAGNOSIS — Z96641 Presence of right artificial hip joint: Secondary | ICD-10-CM | POA: Diagnosis not present

## 2019-04-08 DIAGNOSIS — Z9181 History of falling: Secondary | ICD-10-CM | POA: Diagnosis not present

## 2019-04-08 DIAGNOSIS — Z471 Aftercare following joint replacement surgery: Secondary | ICD-10-CM | POA: Diagnosis not present

## 2019-04-08 DIAGNOSIS — I10 Essential (primary) hypertension: Secondary | ICD-10-CM | POA: Diagnosis not present

## 2019-04-08 DIAGNOSIS — Z7901 Long term (current) use of anticoagulants: Secondary | ICD-10-CM | POA: Diagnosis not present

## 2019-04-11 DIAGNOSIS — Z96641 Presence of right artificial hip joint: Secondary | ICD-10-CM | POA: Diagnosis not present

## 2019-04-11 DIAGNOSIS — Z9181 History of falling: Secondary | ICD-10-CM | POA: Diagnosis not present

## 2019-04-11 DIAGNOSIS — I10 Essential (primary) hypertension: Secondary | ICD-10-CM | POA: Diagnosis not present

## 2019-04-11 DIAGNOSIS — Z7901 Long term (current) use of anticoagulants: Secondary | ICD-10-CM | POA: Diagnosis not present

## 2019-04-11 DIAGNOSIS — Z471 Aftercare following joint replacement surgery: Secondary | ICD-10-CM | POA: Diagnosis not present

## 2019-04-13 ENCOUNTER — Other Ambulatory Visit: Payer: Self-pay | Admitting: *Deleted

## 2019-04-13 DIAGNOSIS — Z471 Aftercare following joint replacement surgery: Secondary | ICD-10-CM | POA: Diagnosis not present

## 2019-04-13 DIAGNOSIS — I10 Essential (primary) hypertension: Secondary | ICD-10-CM | POA: Diagnosis not present

## 2019-04-13 DIAGNOSIS — Z7901 Long term (current) use of anticoagulants: Secondary | ICD-10-CM | POA: Diagnosis not present

## 2019-04-13 DIAGNOSIS — Z96641 Presence of right artificial hip joint: Secondary | ICD-10-CM | POA: Diagnosis not present

## 2019-04-13 DIAGNOSIS — Z9181 History of falling: Secondary | ICD-10-CM | POA: Diagnosis not present

## 2019-04-13 NOTE — Patient Outreach (Signed)
[facility]) Care Management  04/13/2019  Ennis [birth year] [id number]   EMMI General discharge Red Alert  Day #4 Date : 04/12/19 Red Alert Reason : Other questions/problems "yes"  Outreach attempt #1 Subjective:  Successful outreach call to patient, HIPAA confirmed x 2 identifiers. Explained reason for the call , addressed EMMI red alert ,she denies having questions or concerns.  Patient reports that she is getting along fine since her right hip surgery, she is using a walker making good progress . She is active with [facility] at home health physical therapy and anticipating her 2nd visit on today. She reports that her pain is managed with tylenol as needed. She reports having dressing in place from hospital with vac system , that is small and she keeps in her pocket, she states that dressing to be removed on tomorrow and another dressing type placed, she will discuss with therapist at visit today. She has follow up visit with orthopedic surgeon in March.  Patient reports having good support at home, she lives at home with her husband , son and grandson.  She denies having any other questions or concerns at this time.  Discussed with preparedness  for winter storm she states her family is making sure they have batteries,phones charged,    blankets and they have plenty food in house, but only an furnace.    Plan Will close case at this time, no care management needs identified.    Joylene Draft, RN, BSN  [place] Management Coordinator  [phone]- Mobile [phone]- Toll Free Main Office

## 2019-04-14 DIAGNOSIS — Z9181 History of falling: Secondary | ICD-10-CM | POA: Diagnosis not present

## 2019-04-14 DIAGNOSIS — Z96641 Presence of right artificial hip joint: Secondary | ICD-10-CM | POA: Diagnosis not present

## 2019-04-14 DIAGNOSIS — Z471 Aftercare following joint replacement surgery: Secondary | ICD-10-CM | POA: Diagnosis not present

## 2019-04-14 DIAGNOSIS — Z7901 Long term (current) use of anticoagulants: Secondary | ICD-10-CM | POA: Diagnosis not present

## 2019-04-14 DIAGNOSIS — I10 Essential (primary) hypertension: Secondary | ICD-10-CM | POA: Diagnosis not present

## 2019-04-18 DIAGNOSIS — Z96641 Presence of right artificial hip joint: Secondary | ICD-10-CM | POA: Diagnosis not present

## 2019-04-18 DIAGNOSIS — I10 Essential (primary) hypertension: Secondary | ICD-10-CM | POA: Diagnosis not present

## 2019-04-18 DIAGNOSIS — Z7901 Long term (current) use of anticoagulants: Secondary | ICD-10-CM | POA: Diagnosis not present

## 2019-04-18 DIAGNOSIS — Z471 Aftercare following joint replacement surgery: Secondary | ICD-10-CM | POA: Diagnosis not present

## 2019-04-18 DIAGNOSIS — Z9181 History of falling: Secondary | ICD-10-CM | POA: Diagnosis not present

## 2019-04-20 DIAGNOSIS — Z7901 Long term (current) use of anticoagulants: Secondary | ICD-10-CM | POA: Diagnosis not present

## 2019-04-20 DIAGNOSIS — I10 Essential (primary) hypertension: Secondary | ICD-10-CM | POA: Diagnosis not present

## 2019-04-20 DIAGNOSIS — Z471 Aftercare following joint replacement surgery: Secondary | ICD-10-CM | POA: Diagnosis not present

## 2019-04-20 DIAGNOSIS — Z9181 History of falling: Secondary | ICD-10-CM | POA: Diagnosis not present

## 2019-04-20 DIAGNOSIS — Z96641 Presence of right artificial hip joint: Secondary | ICD-10-CM | POA: Diagnosis not present

## 2019-04-22 DIAGNOSIS — Z7901 Long term (current) use of anticoagulants: Secondary | ICD-10-CM | POA: Diagnosis not present

## 2019-04-22 DIAGNOSIS — Z96641 Presence of right artificial hip joint: Secondary | ICD-10-CM | POA: Diagnosis not present

## 2019-04-22 DIAGNOSIS — Z471 Aftercare following joint replacement surgery: Secondary | ICD-10-CM | POA: Diagnosis not present

## 2019-04-22 DIAGNOSIS — I10 Essential (primary) hypertension: Secondary | ICD-10-CM | POA: Diagnosis not present

## 2019-04-22 DIAGNOSIS — Z9181 History of falling: Secondary | ICD-10-CM | POA: Diagnosis not present

## 2019-04-25 DIAGNOSIS — Z7901 Long term (current) use of anticoagulants: Secondary | ICD-10-CM | POA: Diagnosis not present

## 2019-04-25 DIAGNOSIS — Z96641 Presence of right artificial hip joint: Secondary | ICD-10-CM | POA: Diagnosis not present

## 2019-04-25 DIAGNOSIS — Z471 Aftercare following joint replacement surgery: Secondary | ICD-10-CM | POA: Diagnosis not present

## 2019-04-25 DIAGNOSIS — Z9181 History of falling: Secondary | ICD-10-CM | POA: Diagnosis not present

## 2019-04-25 DIAGNOSIS — I10 Essential (primary) hypertension: Secondary | ICD-10-CM | POA: Diagnosis not present

## 2019-04-27 DIAGNOSIS — Z7901 Long term (current) use of anticoagulants: Secondary | ICD-10-CM | POA: Diagnosis not present

## 2019-04-27 DIAGNOSIS — Z9181 History of falling: Secondary | ICD-10-CM | POA: Diagnosis not present

## 2019-04-27 DIAGNOSIS — Z96641 Presence of right artificial hip joint: Secondary | ICD-10-CM | POA: Diagnosis not present

## 2019-04-27 DIAGNOSIS — I10 Essential (primary) hypertension: Secondary | ICD-10-CM | POA: Diagnosis not present

## 2019-04-27 DIAGNOSIS — Z471 Aftercare following joint replacement surgery: Secondary | ICD-10-CM | POA: Diagnosis not present

## 2019-05-02 DIAGNOSIS — Z9181 History of falling: Secondary | ICD-10-CM | POA: Diagnosis not present

## 2019-05-02 DIAGNOSIS — Z471 Aftercare following joint replacement surgery: Secondary | ICD-10-CM | POA: Diagnosis not present

## 2019-05-02 DIAGNOSIS — Z7901 Long term (current) use of anticoagulants: Secondary | ICD-10-CM | POA: Diagnosis not present

## 2019-05-02 DIAGNOSIS — Z96641 Presence of right artificial hip joint: Secondary | ICD-10-CM | POA: Diagnosis not present

## 2019-05-02 DIAGNOSIS — I10 Essential (primary) hypertension: Secondary | ICD-10-CM | POA: Diagnosis not present

## 2019-05-05 DIAGNOSIS — Z471 Aftercare following joint replacement surgery: Secondary | ICD-10-CM | POA: Diagnosis not present

## 2019-05-05 DIAGNOSIS — Z9181 History of falling: Secondary | ICD-10-CM | POA: Diagnosis not present

## 2019-05-05 DIAGNOSIS — Z7901 Long term (current) use of anticoagulants: Secondary | ICD-10-CM | POA: Diagnosis not present

## 2019-05-05 DIAGNOSIS — Z96641 Presence of right artificial hip joint: Secondary | ICD-10-CM | POA: Diagnosis not present

## 2019-05-05 DIAGNOSIS — I10 Essential (primary) hypertension: Secondary | ICD-10-CM | POA: Diagnosis not present

## 2019-05-18 DIAGNOSIS — M1611 Unilateral primary osteoarthritis, right hip: Secondary | ICD-10-CM | POA: Diagnosis not present

## 2019-06-08 DIAGNOSIS — E785 Hyperlipidemia, unspecified: Secondary | ICD-10-CM | POA: Diagnosis not present

## 2019-06-08 DIAGNOSIS — M25559 Pain in unspecified hip: Secondary | ICD-10-CM | POA: Diagnosis not present

## 2019-06-08 DIAGNOSIS — Z0000 Encounter for general adult medical examination without abnormal findings: Secondary | ICD-10-CM | POA: Diagnosis not present

## 2019-06-08 DIAGNOSIS — I10 Essential (primary) hypertension: Secondary | ICD-10-CM | POA: Diagnosis not present

## 2019-08-17 DIAGNOSIS — Z96641 Presence of right artificial hip joint: Secondary | ICD-10-CM | POA: Diagnosis not present

## 2019-08-17 DIAGNOSIS — M247 Protrusio acetabuli: Secondary | ICD-10-CM | POA: Diagnosis not present

## 2019-08-17 DIAGNOSIS — M1611 Unilateral primary osteoarthritis, right hip: Secondary | ICD-10-CM | POA: Diagnosis not present

## 2019-09-05 DIAGNOSIS — I10 Essential (primary) hypertension: Secondary | ICD-10-CM | POA: Diagnosis not present

## 2019-09-05 DIAGNOSIS — E7800 Pure hypercholesterolemia, unspecified: Secondary | ICD-10-CM | POA: Diagnosis not present

## 2019-09-05 DIAGNOSIS — R8290 Unspecified abnormal findings in urine: Secondary | ICD-10-CM | POA: Diagnosis not present

## 2019-09-05 DIAGNOSIS — Z79899 Other long term (current) drug therapy: Secondary | ICD-10-CM | POA: Diagnosis not present

## 2019-09-12 DIAGNOSIS — Z1211 Encounter for screening for malignant neoplasm of colon: Secondary | ICD-10-CM | POA: Diagnosis not present

## 2019-09-12 DIAGNOSIS — I10 Essential (primary) hypertension: Secondary | ICD-10-CM | POA: Diagnosis not present

## 2019-09-12 DIAGNOSIS — E7800 Pure hypercholesterolemia, unspecified: Secondary | ICD-10-CM | POA: Diagnosis not present

## 2020-01-09 ENCOUNTER — Other Ambulatory Visit: Payer: Self-pay | Admitting: Internal Medicine

## 2020-01-09 DIAGNOSIS — Z1211 Encounter for screening for malignant neoplasm of colon: Secondary | ICD-10-CM | POA: Diagnosis not present

## 2020-01-09 DIAGNOSIS — Z1231 Encounter for screening mammogram for malignant neoplasm of breast: Secondary | ICD-10-CM | POA: Diagnosis not present

## 2020-01-09 DIAGNOSIS — E7800 Pure hypercholesterolemia, unspecified: Secondary | ICD-10-CM | POA: Diagnosis not present

## 2020-01-09 DIAGNOSIS — Z79899 Other long term (current) drug therapy: Secondary | ICD-10-CM | POA: Diagnosis not present

## 2020-01-09 DIAGNOSIS — I10 Essential (primary) hypertension: Secondary | ICD-10-CM | POA: Diagnosis not present

## 2020-04-13 ENCOUNTER — Other Ambulatory Visit: Payer: Self-pay

## 2020-04-13 ENCOUNTER — Ambulatory Visit
Admission: RE | Admit: 2020-04-13 | Discharge: 2020-04-13 | Disposition: A | Discharge status: Home / Self Care | Payer: Medicare HMO | Source: Ambulatory Visit | Attending: Internal Medicine | Admitting: Internal Medicine

## 2020-04-13 DIAGNOSIS — Z1231 Encounter for screening mammogram for malignant neoplasm of breast: Secondary | ICD-10-CM | POA: Diagnosis not present

## 2020-04-19 ENCOUNTER — Other Ambulatory Visit: Payer: Self-pay | Admitting: Internal Medicine

## 2020-04-19 DIAGNOSIS — N6320 Unspecified lump in the left breast, unspecified quadrant: Secondary | ICD-10-CM

## 2020-04-19 DIAGNOSIS — R928 Other abnormal and inconclusive findings on diagnostic imaging of breast: Secondary | ICD-10-CM

## 2020-04-23 ENCOUNTER — Other Ambulatory Visit: Payer: Self-pay

## 2020-04-23 ENCOUNTER — Ambulatory Visit
Admission: RE | Admit: 2020-04-23 | Discharge: 2020-04-23 | Disposition: A | Discharge status: Home / Self Care | Payer: Medicare HMO | Source: Ambulatory Visit | Attending: Internal Medicine | Admitting: Internal Medicine

## 2020-04-23 DIAGNOSIS — N6320 Unspecified lump in the left breast, unspecified quadrant: Secondary | ICD-10-CM | POA: Insufficient documentation

## 2020-04-23 DIAGNOSIS — R928 Other abnormal and inconclusive findings on diagnostic imaging of breast: Secondary | ICD-10-CM | POA: Diagnosis not present

## 2020-04-25 ENCOUNTER — Other Ambulatory Visit: Payer: Self-pay | Admitting: Internal Medicine

## 2020-04-25 DIAGNOSIS — N6320 Unspecified lump in the left breast, unspecified quadrant: Secondary | ICD-10-CM

## 2020-06-20 DIAGNOSIS — Z79899 Other long term (current) drug therapy: Secondary | ICD-10-CM | POA: Diagnosis not present

## 2020-06-20 DIAGNOSIS — E7800 Pure hypercholesterolemia, unspecified: Secondary | ICD-10-CM | POA: Diagnosis not present

## 2020-06-20 DIAGNOSIS — I10 Essential (primary) hypertension: Secondary | ICD-10-CM | POA: Diagnosis not present

## 2020-06-27 DIAGNOSIS — I10 Essential (primary) hypertension: Secondary | ICD-10-CM | POA: Diagnosis not present

## 2020-06-27 DIAGNOSIS — Z0000 Encounter for general adult medical examination without abnormal findings: Secondary | ICD-10-CM | POA: Diagnosis not present

## 2020-06-28 DIAGNOSIS — Z1211 Encounter for screening for malignant neoplasm of colon: Secondary | ICD-10-CM | POA: Diagnosis not present

## 2020-07-31 ENCOUNTER — Ambulatory Visit
Admission: RE | Admit: 2020-07-31 | Discharge: 2020-07-31 | Disposition: A | Discharge status: Home / Self Care | Payer: Medicare HMO | Source: Ambulatory Visit | Attending: Internal Medicine | Admitting: Internal Medicine

## 2020-07-31 ENCOUNTER — Other Ambulatory Visit: Payer: Medicare HMO

## 2020-07-31 ENCOUNTER — Other Ambulatory Visit: Payer: Self-pay

## 2020-07-31 DIAGNOSIS — N6321 Unspecified lump in the left breast, upper outer quadrant: Secondary | ICD-10-CM | POA: Diagnosis not present

## 2020-07-31 DIAGNOSIS — N6320 Unspecified lump in the left breast, unspecified quadrant: Secondary | ICD-10-CM | POA: Diagnosis not present

## 2020-08-02 ENCOUNTER — Other Ambulatory Visit: Payer: Self-pay | Admitting: Internal Medicine

## 2020-08-02 DIAGNOSIS — N6320 Unspecified lump in the left breast, unspecified quadrant: Secondary | ICD-10-CM

## 2020-08-02 DIAGNOSIS — R928 Other abnormal and inconclusive findings on diagnostic imaging of breast: Secondary | ICD-10-CM

## 2020-08-08 ENCOUNTER — Other Ambulatory Visit: Payer: Self-pay

## 2020-08-08 ENCOUNTER — Ambulatory Visit
Admission: RE | Admit: 2020-08-08 | Discharge: 2020-08-08 | Disposition: A | Discharge status: Home / Self Care | Payer: Medicare HMO | Source: Ambulatory Visit | Attending: Internal Medicine | Admitting: Internal Medicine

## 2020-08-08 DIAGNOSIS — N6321 Unspecified lump in the left breast, upper outer quadrant: Secondary | ICD-10-CM | POA: Diagnosis not present

## 2020-08-08 DIAGNOSIS — N6320 Unspecified lump in the left breast, unspecified quadrant: Secondary | ICD-10-CM

## 2020-08-08 DIAGNOSIS — R928 Other abnormal and inconclusive findings on diagnostic imaging of breast: Secondary | ICD-10-CM | POA: Diagnosis not present

## 2020-08-08 HISTORY — PX: BREAST BIOPSY: SHX20

## 2020-08-09 LAB — SURGICAL PATHOLOGY

## 2020-10-15 DIAGNOSIS — E785 Hyperlipidemia, unspecified: Secondary | ICD-10-CM | POA: Diagnosis not present

## 2020-10-15 DIAGNOSIS — Z0000 Encounter for general adult medical examination without abnormal findings: Secondary | ICD-10-CM | POA: Diagnosis not present

## 2020-10-15 DIAGNOSIS — Z79899 Other long term (current) drug therapy: Secondary | ICD-10-CM | POA: Diagnosis not present

## 2020-10-15 DIAGNOSIS — I10 Essential (primary) hypertension: Secondary | ICD-10-CM | POA: Diagnosis not present

## 2021-01-29 DIAGNOSIS — Z79899 Other long term (current) drug therapy: Secondary | ICD-10-CM | POA: Diagnosis not present

## 2021-01-29 DIAGNOSIS — I10 Essential (primary) hypertension: Secondary | ICD-10-CM | POA: Diagnosis not present

## 2021-01-29 DIAGNOSIS — R8290 Unspecified abnormal findings in urine: Secondary | ICD-10-CM | POA: Diagnosis not present

## 2021-01-29 DIAGNOSIS — E7800 Pure hypercholesterolemia, unspecified: Secondary | ICD-10-CM | POA: Diagnosis not present

## 2021-01-30 DIAGNOSIS — E7800 Pure hypercholesterolemia, unspecified: Secondary | ICD-10-CM | POA: Diagnosis not present

## 2021-01-30 DIAGNOSIS — I10 Essential (primary) hypertension: Secondary | ICD-10-CM | POA: Diagnosis not present

## 2021-06-03 ENCOUNTER — Other Ambulatory Visit: Payer: Self-pay | Admitting: Internal Medicine

## 2021-06-03 DIAGNOSIS — I10 Essential (primary) hypertension: Secondary | ICD-10-CM | POA: Diagnosis not present

## 2021-06-03 DIAGNOSIS — Z79899 Other long term (current) drug therapy: Secondary | ICD-10-CM | POA: Diagnosis not present

## 2021-06-03 DIAGNOSIS — E7800 Pure hypercholesterolemia, unspecified: Secondary | ICD-10-CM | POA: Diagnosis not present

## 2021-06-03 DIAGNOSIS — Z1231 Encounter for screening mammogram for malignant neoplasm of breast: Secondary | ICD-10-CM | POA: Diagnosis not present

## 2021-07-25 ENCOUNTER — Ambulatory Visit
Admission: RE | Admit: 2021-07-25 | Discharge: 2021-07-25 | Disposition: A | Discharge status: Home / Self Care | Payer: Medicare HMO | Source: Ambulatory Visit | Attending: Internal Medicine | Admitting: Internal Medicine

## 2021-07-25 DIAGNOSIS — Z1231 Encounter for screening mammogram for malignant neoplasm of breast: Secondary | ICD-10-CM | POA: Diagnosis not present

## 2021-10-09 DIAGNOSIS — Z79899 Other long term (current) drug therapy: Secondary | ICD-10-CM | POA: Diagnosis not present

## 2021-10-09 DIAGNOSIS — I129 Hypertensive chronic kidney disease with stage 1 through stage 4 chronic kidney disease, or unspecified chronic kidney disease: Secondary | ICD-10-CM | POA: Diagnosis not present

## 2021-10-09 DIAGNOSIS — Z1211 Encounter for screening for malignant neoplasm of colon: Secondary | ICD-10-CM | POA: Diagnosis not present

## 2021-10-09 DIAGNOSIS — R8290 Unspecified abnormal findings in urine: Secondary | ICD-10-CM | POA: Diagnosis not present

## 2021-10-09 DIAGNOSIS — Z0000 Encounter for general adult medical examination without abnormal findings: Secondary | ICD-10-CM | POA: Diagnosis not present

## 2021-10-09 DIAGNOSIS — N182 Chronic kidney disease, stage 2 (mild): Secondary | ICD-10-CM | POA: Diagnosis not present

## 2021-10-09 DIAGNOSIS — E785 Hyperlipidemia, unspecified: Secondary | ICD-10-CM | POA: Diagnosis not present

## 2021-11-11 DIAGNOSIS — N289 Disorder of kidney and ureter, unspecified: Secondary | ICD-10-CM | POA: Diagnosis not present

## 2022-04-15 DIAGNOSIS — I129 Hypertensive chronic kidney disease with stage 1 through stage 4 chronic kidney disease, or unspecified chronic kidney disease: Secondary | ICD-10-CM | POA: Diagnosis not present

## 2022-04-15 DIAGNOSIS — Z1211 Encounter for screening for malignant neoplasm of colon: Secondary | ICD-10-CM | POA: Diagnosis not present

## 2022-04-15 DIAGNOSIS — R8290 Unspecified abnormal findings in urine: Secondary | ICD-10-CM | POA: Diagnosis not present

## 2022-04-15 DIAGNOSIS — Z1231 Encounter for screening mammogram for malignant neoplasm of breast: Secondary | ICD-10-CM | POA: Diagnosis not present

## 2022-04-15 DIAGNOSIS — Z0000 Encounter for general adult medical examination without abnormal findings: Secondary | ICD-10-CM | POA: Diagnosis not present

## 2022-04-15 DIAGNOSIS — E785 Hyperlipidemia, unspecified: Secondary | ICD-10-CM | POA: Diagnosis not present

## 2022-04-15 DIAGNOSIS — Z79899 Other long term (current) drug therapy: Secondary | ICD-10-CM | POA: Diagnosis not present

## 2022-04-15 DIAGNOSIS — N189 Chronic kidney disease, unspecified: Secondary | ICD-10-CM | POA: Diagnosis not present

## 2022-04-16 ENCOUNTER — Other Ambulatory Visit: Payer: Self-pay | Admitting: Internal Medicine

## 2022-04-16 DIAGNOSIS — Z1231 Encounter for screening mammogram for malignant neoplasm of breast: Secondary | ICD-10-CM

## 2022-04-28 DIAGNOSIS — Z1211 Encounter for screening for malignant neoplasm of colon: Secondary | ICD-10-CM | POA: Diagnosis not present

## 2022-07-28 ENCOUNTER — Ambulatory Visit
Admission: RE | Admit: 2022-07-28 | Discharge: 2022-07-28 | Disposition: A | Discharge status: Home / Self Care | Payer: Medicare HMO | Source: Ambulatory Visit | Attending: Internal Medicine | Admitting: Internal Medicine

## 2022-07-28 DIAGNOSIS — Z1231 Encounter for screening mammogram for malignant neoplasm of breast: Secondary | ICD-10-CM

## 2022-10-22 DIAGNOSIS — I129 Hypertensive chronic kidney disease with stage 1 through stage 4 chronic kidney disease, or unspecified chronic kidney disease: Secondary | ICD-10-CM | POA: Diagnosis not present

## 2022-10-22 DIAGNOSIS — N182 Chronic kidney disease, stage 2 (mild): Secondary | ICD-10-CM | POA: Diagnosis not present

## 2022-10-22 DIAGNOSIS — R8290 Unspecified abnormal findings in urine: Secondary | ICD-10-CM | POA: Diagnosis not present

## 2022-10-22 DIAGNOSIS — E7800 Pure hypercholesterolemia, unspecified: Secondary | ICD-10-CM | POA: Diagnosis not present

## 2022-10-22 DIAGNOSIS — Z0000 Encounter for general adult medical examination without abnormal findings: Secondary | ICD-10-CM | POA: Diagnosis not present

## 2022-12-02 IMAGING — MG MM DIGITAL SCREENING BILAT W/ TOMO AND CAD
8 of 14 series · 8 of 40 positions shown · non-contrast
Comparison: Previous exam(s).

CLINICAL DATA: Screening.

EXAM:
DIGITAL SCREENING BILATERAL MAMMOGRAM WITH TOMOSYNTHESIS AND CAD
TECHNIQUE: Bilateral screening digital craniocaudal and mediolateral oblique
mammograms were obtained. Bilateral screening digital breast
tomosynthesis was performed. The images were evaluated with
computer-aided detection.

[R CC synth-2D (1 of 2)]
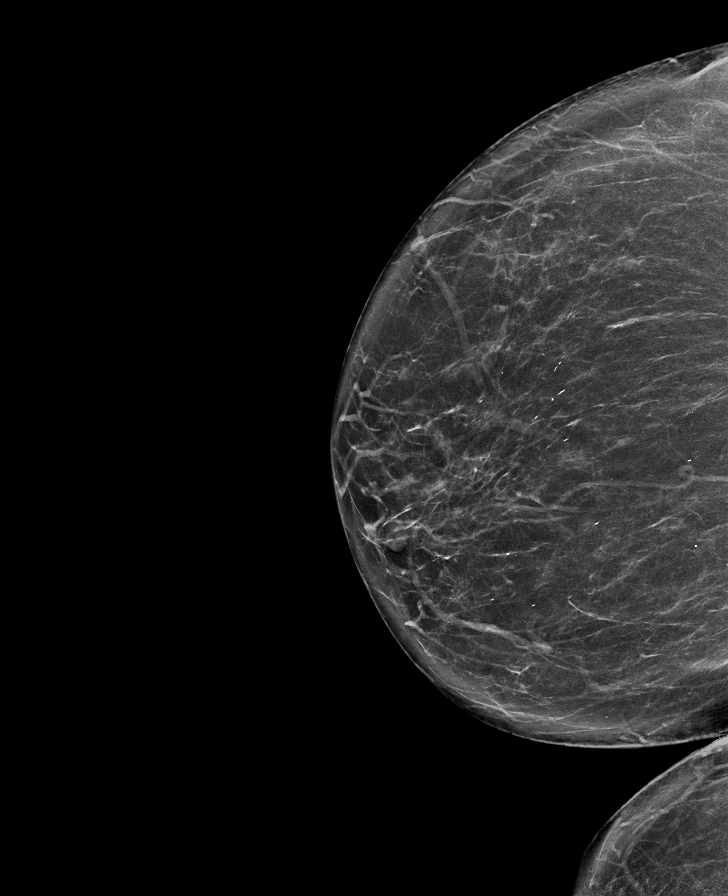

[R MLO synth-2D (1 of 2)]
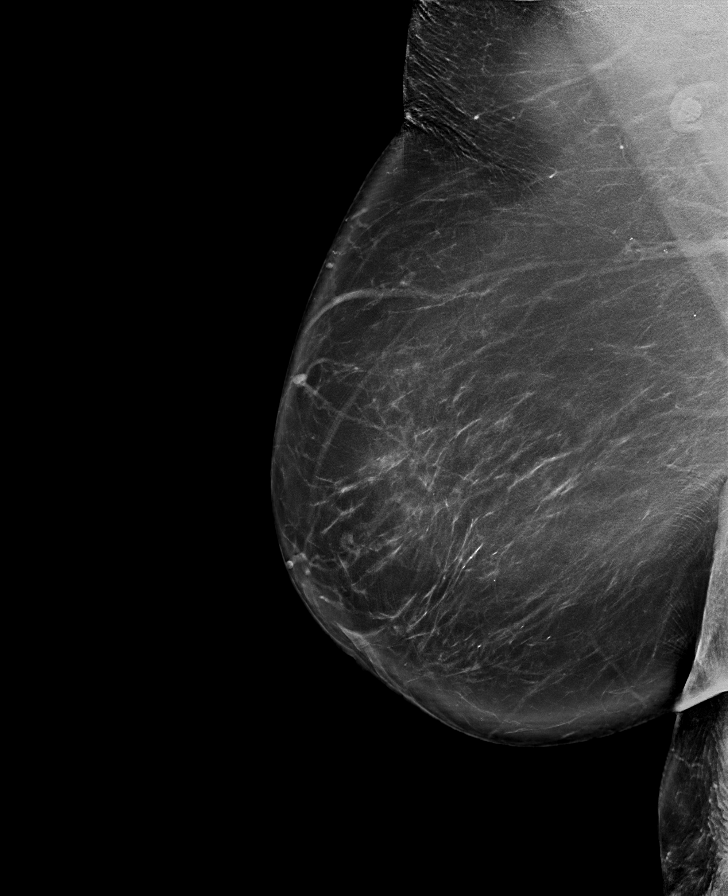

[L CC synth-2D]
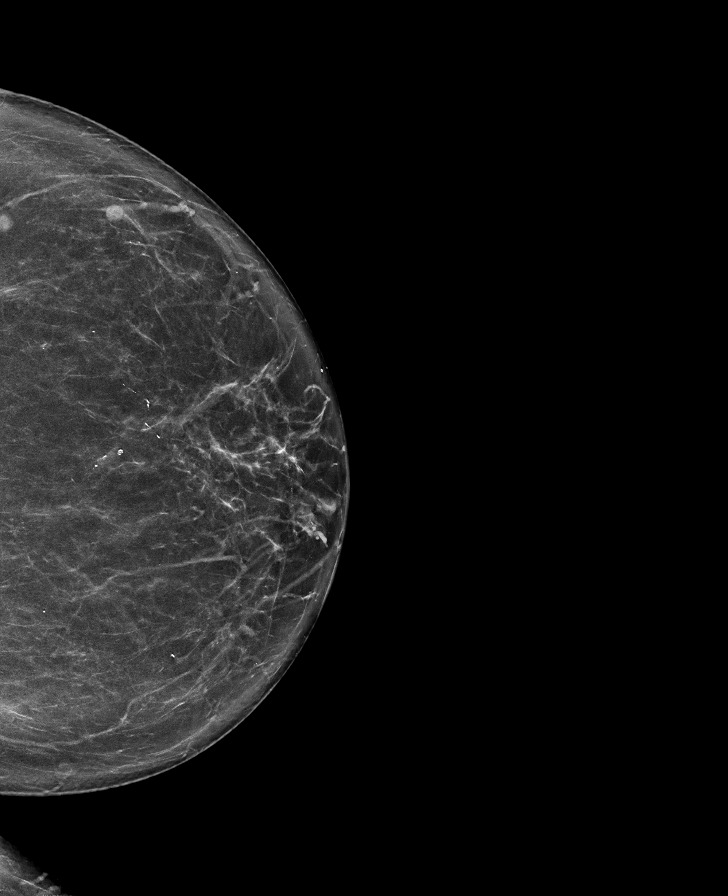

[R CC synth-2D (2 of 2)]
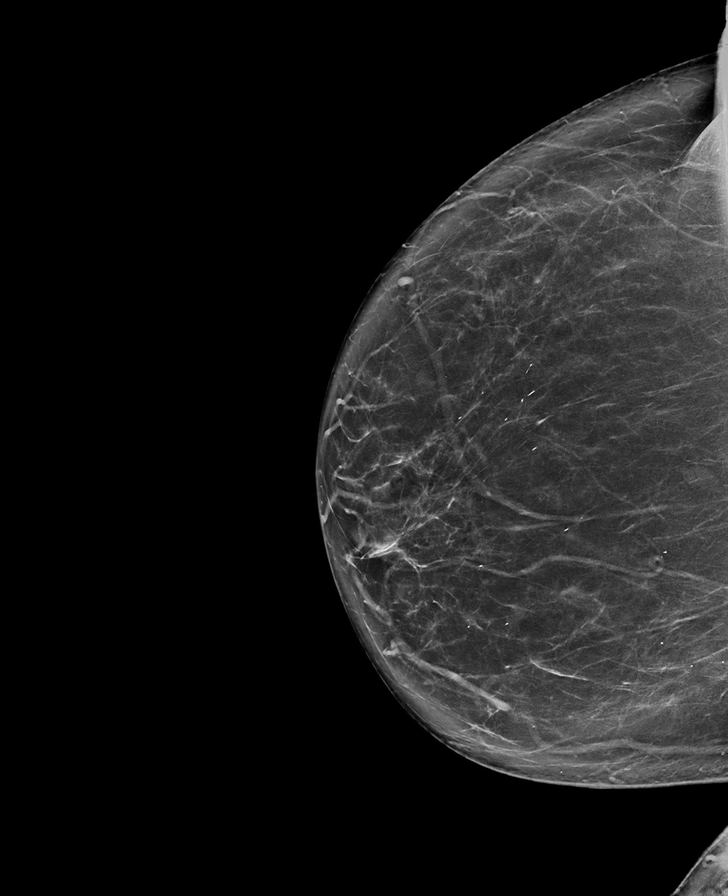

[L MLO synth-2D (1 of 2)]
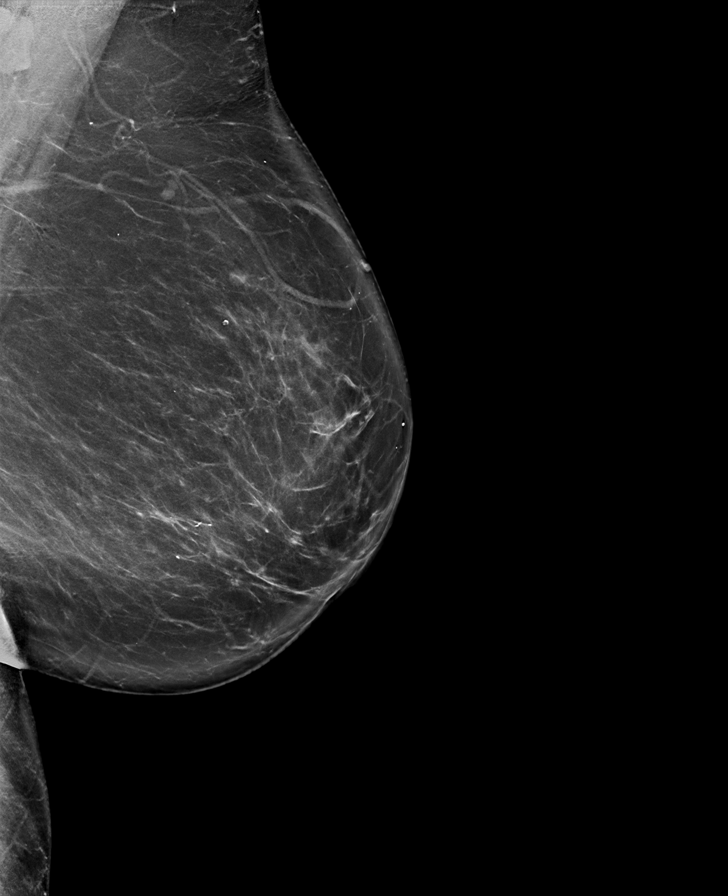

[L MLO synth-2D (2 of 2)]
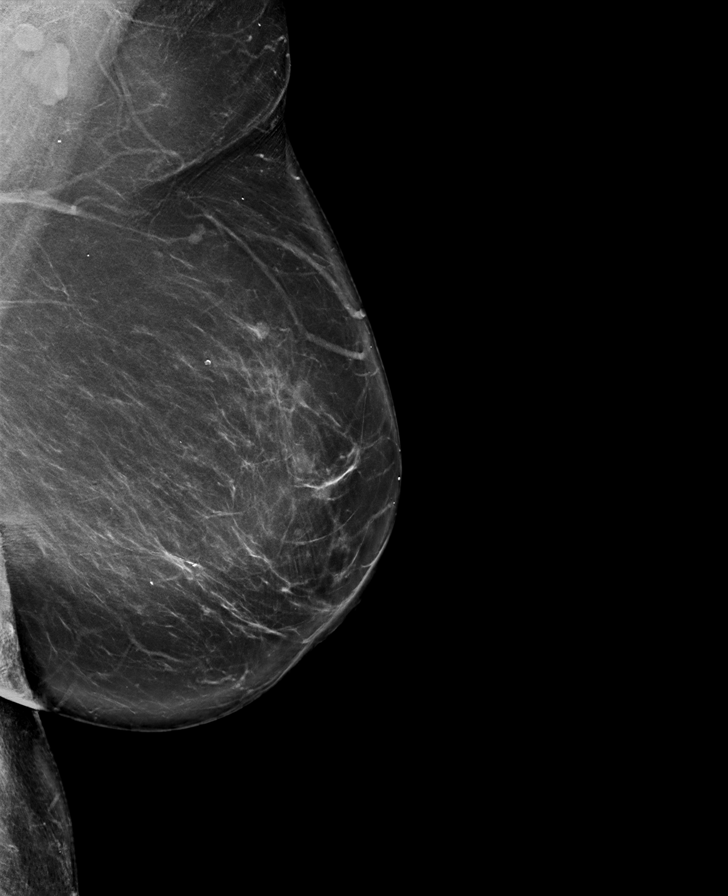

[R MLO synth-2D (2 of 2)]
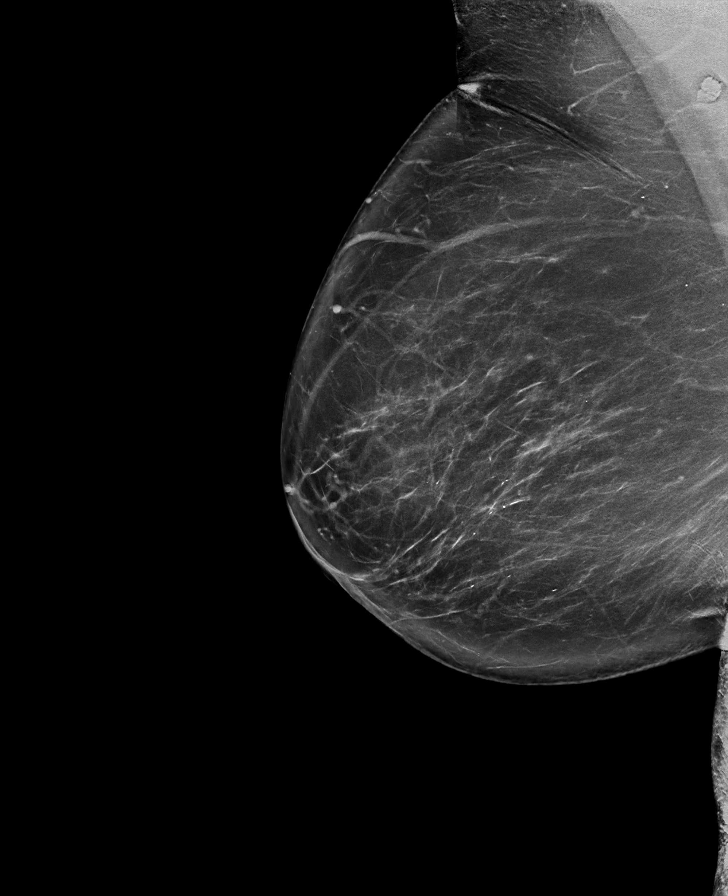

[R MLO tomo · tomo slice 47/93.0]
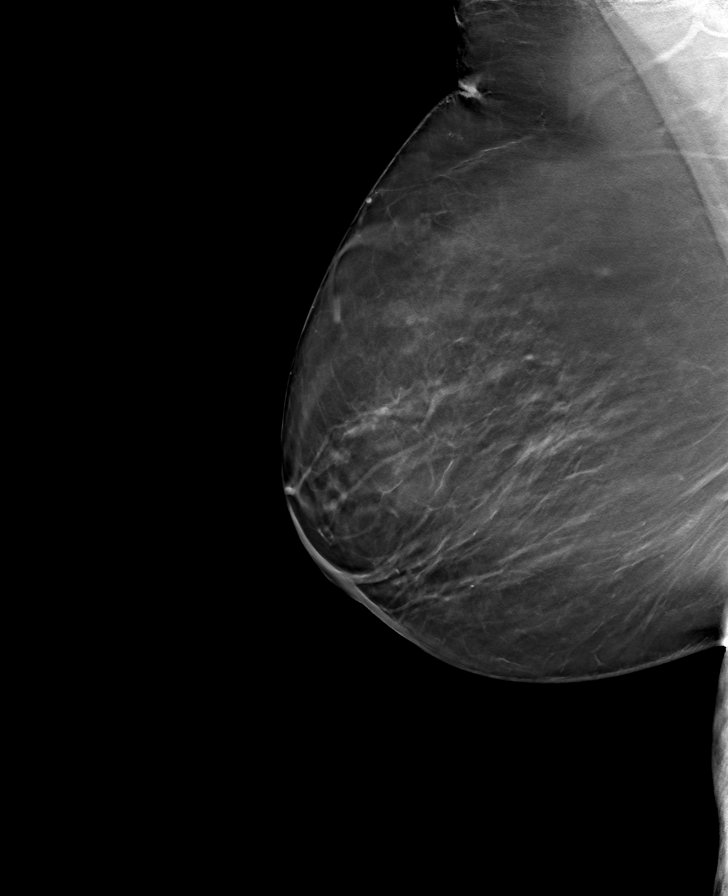

[8 of 40 positions shown; findings below may reference images not displayed]

ACR Breast Density Category b: There are scattered areas of
fibroglandular density.
FINDINGS: In the left breast, a possible mass warrants further evaluation. In
the right breast, no findings suspicious for malignancy.
IMPRESSION: Further evaluation is suggested for possible mass in the left
breast.

RECOMMENDATION:
Ultrasound of the left breast. (Code:[id number])

The patient will be contacted regarding the findings, and additional
imaging will be scheduled.

BI-RADS CATEGORY  0: Incomplete. Need additional imaging evaluation
and/or prior mammograms for comparison.

## 2023-04-28 ENCOUNTER — Other Ambulatory Visit: Payer: Self-pay | Admitting: Internal Medicine

## 2023-04-28 DIAGNOSIS — E785 Hyperlipidemia, unspecified: Secondary | ICD-10-CM | POA: Diagnosis not present

## 2023-04-28 DIAGNOSIS — R8290 Unspecified abnormal findings in urine: Secondary | ICD-10-CM | POA: Diagnosis not present

## 2023-04-28 DIAGNOSIS — Z1231 Encounter for screening mammogram for malignant neoplasm of breast: Secondary | ICD-10-CM

## 2023-04-28 DIAGNOSIS — Z1211 Encounter for screening for malignant neoplasm of colon: Secondary | ICD-10-CM | POA: Diagnosis not present

## 2023-04-28 DIAGNOSIS — N189 Chronic kidney disease, unspecified: Secondary | ICD-10-CM | POA: Diagnosis not present

## 2023-04-28 DIAGNOSIS — Z0000 Encounter for general adult medical examination without abnormal findings: Secondary | ICD-10-CM | POA: Diagnosis not present

## 2023-04-28 DIAGNOSIS — Z79899 Other long term (current) drug therapy: Secondary | ICD-10-CM | POA: Diagnosis not present

## 2023-04-28 DIAGNOSIS — I129 Hypertensive chronic kidney disease with stage 1 through stage 4 chronic kidney disease, or unspecified chronic kidney disease: Secondary | ICD-10-CM | POA: Diagnosis not present

## 2023-05-19 DIAGNOSIS — M25552 Pain in left hip: Secondary | ICD-10-CM | POA: Diagnosis not present

## 2023-05-19 DIAGNOSIS — Z96641 Presence of right artificial hip joint: Secondary | ICD-10-CM | POA: Diagnosis not present

## 2023-05-19 DIAGNOSIS — M4807 Spinal stenosis, lumbosacral region: Secondary | ICD-10-CM | POA: Diagnosis not present

## 2023-05-19 DIAGNOSIS — M9953 Intervertebral disc stenosis of neural canal of lumbar region: Secondary | ICD-10-CM | POA: Diagnosis not present

## 2023-05-20 ENCOUNTER — Other Ambulatory Visit: Payer: Self-pay | Admitting: Orthopedic Surgery

## 2023-05-20 DIAGNOSIS — M4807 Spinal stenosis, lumbosacral region: Secondary | ICD-10-CM

## 2023-09-09 DIAGNOSIS — I10 Essential (primary) hypertension: Secondary | ICD-10-CM | POA: Diagnosis not present

## 2023-09-18 DIAGNOSIS — F4322 Adjustment disorder with anxiety: Secondary | ICD-10-CM | POA: Diagnosis not present

## 2023-09-18 DIAGNOSIS — R350 Frequency of micturition: Secondary | ICD-10-CM | POA: Diagnosis not present

## 2023-09-18 DIAGNOSIS — N390 Urinary tract infection, site not specified: Secondary | ICD-10-CM | POA: Diagnosis not present

## 2023-09-21 DIAGNOSIS — F4322 Adjustment disorder with anxiety: Secondary | ICD-10-CM | POA: Diagnosis not present

## 2023-09-21 DIAGNOSIS — F410 Panic disorder [episodic paroxysmal anxiety] without agoraphobia: Secondary | ICD-10-CM | POA: Diagnosis not present

## 2023-09-21 DIAGNOSIS — R350 Frequency of micturition: Secondary | ICD-10-CM | POA: Diagnosis not present

## 2023-11-03 DIAGNOSIS — E7800 Pure hypercholesterolemia, unspecified: Secondary | ICD-10-CM | POA: Diagnosis not present

## 2023-11-03 DIAGNOSIS — Z1211 Encounter for screening for malignant neoplasm of colon: Secondary | ICD-10-CM | POA: Diagnosis not present

## 2023-11-03 DIAGNOSIS — Z0000 Encounter for general adult medical examination without abnormal findings: Secondary | ICD-10-CM | POA: Diagnosis not present

## 2023-11-03 DIAGNOSIS — Z79899 Other long term (current) drug therapy: Secondary | ICD-10-CM | POA: Diagnosis not present

## 2023-11-03 DIAGNOSIS — Z1331 Encounter for screening for depression: Secondary | ICD-10-CM | POA: Diagnosis not present

## 2023-11-03 DIAGNOSIS — Z1231 Encounter for screening mammogram for malignant neoplasm of breast: Secondary | ICD-10-CM | POA: Diagnosis not present

## 2023-11-03 DIAGNOSIS — I10 Essential (primary) hypertension: Secondary | ICD-10-CM | POA: Diagnosis not present

## 2023-12-24 ENCOUNTER — Ambulatory Visit
Admission: RE | Admit: 2023-12-24 | Discharge: 2023-12-24 | Disposition: A | Discharge status: Home / Self Care | Source: Ambulatory Visit | Attending: Internal Medicine | Admitting: Internal Medicine

## 2023-12-24 DIAGNOSIS — Z1231 Encounter for screening mammogram for malignant neoplasm of breast: Secondary | ICD-10-CM | POA: Diagnosis not present
